# Patient Record
Sex: Female | Born: 1990 | Race: White | Hispanic: No | Marital: Single | Smoking: Never smoker
Health system: Southern US, Community
[De-identification: ages and names within clinical notes are randomized; demographics above are authoritative.]

## PROBLEM LIST (undated history)

## (undated) DIAGNOSIS — F419 Anxiety disorder, unspecified: Secondary | ICD-10-CM

## (undated) DIAGNOSIS — F329 Major depressive disorder, single episode, unspecified: Secondary | ICD-10-CM

## (undated) DIAGNOSIS — R55 Syncope and collapse: Secondary | ICD-10-CM

## (undated) DIAGNOSIS — N809 Endometriosis, unspecified: Secondary | ICD-10-CM

## (undated) DIAGNOSIS — R51 Headache: Secondary | ICD-10-CM

## (undated) DIAGNOSIS — G43909 Migraine, unspecified, not intractable, without status migrainosus: Secondary | ICD-10-CM

## (undated) DIAGNOSIS — F32A Depression, unspecified: Secondary | ICD-10-CM

## (undated) DIAGNOSIS — R519 Headache, unspecified: Secondary | ICD-10-CM

## (undated) DIAGNOSIS — E349 Endocrine disorder, unspecified: Secondary | ICD-10-CM

## (undated) HISTORY — DX: Migraine, unspecified, not intractable, without status migrainosus: G43.909

## (undated) HISTORY — DX: Depression, unspecified: F32.A

## (undated) HISTORY — DX: Endometriosis, unspecified: N80.9

## (undated) HISTORY — PX: LAPAROSCOPY: SHX197

## (undated) HISTORY — DX: Anxiety disorder, unspecified: F41.9

## (undated) HISTORY — DX: Endocrine disorder, unspecified: E34.9

## (undated) HISTORY — DX: Syncope and collapse: R55

## (undated) HISTORY — DX: Headache, unspecified: R51.9

## (undated) HISTORY — DX: Headache: R51

## (undated) HISTORY — DX: Major depressive disorder, single episode, unspecified: F32.9

---

## 2012-04-27 HISTORY — PX: APPENDECTOMY: SHX54

## 2012-06-25 LAB — CBC
HCT: 42.6 % (ref 35.0–47.0)
HGB: 14.4 g/dL (ref 12.0–16.0)
MCH: 32.8 pg (ref 26.0–34.0)
MCHC: 33.9 g/dL (ref 32.0–36.0)
MCV: 97 fL (ref 80–100)
Platelet: 243 10*3/uL (ref 150–440)
RDW: 12.3 % (ref 11.5–14.5)
WBC: 15.8 10*3/uL — ABNORMAL HIGH (ref 3.6–11.0)

## 2012-06-25 LAB — COMPREHENSIVE METABOLIC PANEL
Albumin: 4.5 g/dL (ref 3.4–5.0)
BUN: 6 mg/dL — ABNORMAL LOW (ref 7–18)
Bilirubin,Total: 0.9 mg/dL (ref 0.2–1.0)
EGFR (African American): >60
EGFR (Non-African Amer.): >60
Glucose: 112 mg/dL — ABNORMAL HIGH (ref 65–99)
Osmolality: 272 (ref 275–301)
Potassium: 4.4 mmol/L (ref 3.5–5.1)
SGOT(AST): 16 U/L (ref 15–37)
SGPT (ALT): 19 U/L (ref 12–78)
Sodium: 137 mmol/L (ref 136–145)

## 2012-06-25 LAB — URINALYSIS, COMPLETE
Bilirubin,UR: NEGATIVE
Glucose,UR: NEGATIVE mg/dL (ref 0–75)
Ketone: NEGATIVE
Nitrite: POSITIVE
Protein: NEGATIVE
RBC,UR: 2 /HPF (ref 0–5)
Squamous Epithelial: 2
WBC UR: 7 /HPF (ref 0–5)

## 2012-06-25 LAB — LIPASE, BLOOD: Lipase: 110 U/L (ref 73–393)

## 2012-06-27 ENCOUNTER — Inpatient Hospital Stay: Payer: Self-pay | Admitting: Surgery

## 2012-06-27 LAB — URINALYSIS, COMPLETE
Bilirubin,UR: NEGATIVE
Blood: NEGATIVE
Glucose,UR: NEGATIVE mg/dL (ref 0–75)
Nitrite: NEGATIVE
Ph: 6 (ref 4.5–8.0)
Protein: NEGATIVE
RBC,UR: 1 /HPF (ref 0–5)
Squamous Epithelial: 11
WBC UR: 2 /HPF (ref 0–5)

## 2012-06-28 LAB — PATHOLOGY REPORT

## 2014-02-20 ENCOUNTER — Ambulatory Visit (INDEPENDENT_AMBULATORY_CARE_PROVIDER_SITE_OTHER): Payer: PRIVATE HEALTH INSURANCE | Admitting: Licensed Clinical Social Worker

## 2014-02-20 DIAGNOSIS — F321 Major depressive disorder, single episode, moderate: Secondary | ICD-10-CM

## 2014-02-27 ENCOUNTER — Encounter: Payer: Self-pay | Admitting: Physician Assistant

## 2014-02-27 ENCOUNTER — Ambulatory Visit (INDEPENDENT_AMBULATORY_CARE_PROVIDER_SITE_OTHER): Payer: PRIVATE HEALTH INSURANCE | Admitting: Physician Assistant

## 2014-02-27 ENCOUNTER — Other Ambulatory Visit (HOSPITAL_COMMUNITY)
Admission: RE | Admit: 2014-02-27 | Discharge: 2014-02-27 | Disposition: A | Discharge status: Home / Self Care | Payer: 59 | Source: Ambulatory Visit | Attending: Physician Assistant | Admitting: Physician Assistant

## 2014-02-27 ENCOUNTER — Ambulatory Visit (INDEPENDENT_AMBULATORY_CARE_PROVIDER_SITE_OTHER): Payer: PRIVATE HEALTH INSURANCE | Admitting: Licensed Clinical Social Worker

## 2014-02-27 VITALS — BP 117/66 | HR 89 | Temp 98.4°F | Resp 16 | Ht 63.5 in | Wt 119.2 lb

## 2014-02-27 DIAGNOSIS — N760 Acute vaginitis: Secondary | ICD-10-CM | POA: Diagnosis present

## 2014-02-27 DIAGNOSIS — N898 Other specified noninflammatory disorders of vagina: Secondary | ICD-10-CM

## 2014-02-27 DIAGNOSIS — F4001 Agoraphobia with panic disorder: Secondary | ICD-10-CM | POA: Insufficient documentation

## 2014-02-27 DIAGNOSIS — F321 Major depressive disorder, single episode, moderate: Secondary | ICD-10-CM | POA: Diagnosis not present

## 2014-02-27 DIAGNOSIS — B9689 Other specified bacterial agents as the cause of diseases classified elsewhere: Secondary | ICD-10-CM | POA: Insufficient documentation

## 2014-02-27 DIAGNOSIS — Z113 Encounter for screening for infections with a predominantly sexual mode of transmission: Secondary | ICD-10-CM | POA: Insufficient documentation

## 2014-02-27 MED ORDER — DIAZEPAM 2 MG PO TABS: 2.0000 mg | ORAL_TABLET | Freq: Two times a day (BID) | ORAL | Status: DC | PRN

## 2014-02-27 MED ORDER — ESCITALOPRAM OXALATE 20 MG PO TABS: ORAL_TABLET | ORAL | Status: DC

## 2014-02-27 NOTE — Progress Notes (Signed)
Patient presents to clinic today to establish care.  Acute Concerns: Anxiety and Depression -- Endorses history over the past year worsening over the past few months.  Endorses panic attacks and difficulty with open spaces and large crowds.  Endorses miscarriage earlier in the area.  Is seeing Berniece AndreasJulie Whitt for counseling once weekly.  Endorses difficulty with work.  Endorses depressed mood and anhedonia.  Denies SI/HI.  Has never been on medications for symptoms.  Endorses foul-smelling discharge from the vagina x 1 day.  Is sexually active with one female partner.  Is currently on junel for contraception.  Endorses good condom use.  Past Medical History  Diagnosis Date  . Depression   . Anxiety   . Fainting spell   . Frequent headaches   . Migraines   . Endometriosis   . Hormone imbalance     Past Surgical History  Procedure Laterality Date  . Laparoscopy      x2  . Appendectomy  2014    No current outpatient prescriptions on file prior to visit.   No current facility-administered medications on file prior to visit.    Allergies  Allergen Reactions  . Codeine Nausea Only  . Mirena [Levonorgestrel]     Damage to Uterus    Family History  Problem Relation Age of Onset  . Alcoholism Father     Deceased  . Liver cancer Father   . Cirrhosis Father   . Lung cancer Father   . Thyroid disease Mother     Living  . Anxiety disorder Mother   . Breast cancer Paternal Grandmother   . Heart disease Maternal Grandfather   . Thyroid disease Maternal Grandmother   . Aneurysm Maternal Aunt     Brain  . Colon cancer Paternal Uncle   . Hyperlipidemia Brother   . ADD / ADHD Brother     History   Social History  . Marital Status: Single    Spouse Name: N/A    Number of Children: N/A  . Years of Education: N/A   Occupational History  . Not on file.   Social History Main Topics  . Smoking status: Never Smoker   . Smokeless tobacco: Never Used  . Alcohol Use: 0.0  oz/week    0 Not specified per week     Comment: rare  . Drug Use: Yes     Comment: rare Marijuana  . Sexual Activity:    Partners: Male    Birth Control/ Protection: Pill   Other Topics Concern  . Not on file   Social History Narrative   ROS See HPI.  All other ROS are negative.  BP 117/66 mmHg  Pulse 89  Temp(Src) 98.4 F (36.9 C) (Oral)  Resp 16  Ht 5' 3.5" (1.613 m)  Wt 119 lb 4 oz (54.091 kg)  BMI 20.79 kg/m2  SpO2 99%  Physical Exam  Constitutional: She is oriented to person, place, and time and well-developed, well-nourished, and in no distress.  HENT:  Head: Normocephalic and atraumatic.  Right Ear: External ear normal.  Left Ear: External ear normal.  Nose: Nose normal.  Mouth/Throat: Oropharynx is clear and moist. No oropharyngeal exudate.  TM within normal limits bilaterally.  Eyes: Conjunctivae are normal. Pupils are equal, round, and reactive to light.  Neck: Neck supple. No thyromegaly present.  Cardiovascular: Normal rate, regular rhythm, normal heart sounds and intact distal pulses.   Pulmonary/Chest: Effort normal and breath sounds normal. No respiratory distress. She has no wheezes.  She has no rales. She exhibits no tenderness.  Genitourinary:  Patient defers exam due to female provider.  Lymphadenopathy:    She has no cervical adenopathy.  Neurological: She is alert and oriented to person, place, and time. No cranial nerve deficit.  Skin: Skin is warm. No rash noted.  Psychiatric: Judgment normal. Her mood appears anxious. She does not express impulsivity. She exhibits a depressed mood. She expresses no homicidal and no suicidal ideation. She expresses no suicidal plans and no homicidal plans. She exhibits ordered thought content.  Vitals reviewed.  Assessment/Plan: Foul smelling vaginal discharge Patient defers exam.  Sounds like BV.  Will obtain urine ancillary testing for causes of vaginitis.  Begin probiotic.  Conservative/Home measures  discussed with patient.  Will treat based on results.  Major depressive disorder, single episode, moderate Will begin Lexapro 10 mg nightly x 2 weeks.  Then increase to 20 mg nightly.  Continue weekly counseling.  ADRs of medication discussed with patient including rare but serious side effect of SI/HI.  Patient knows to stop medication and call office/seek help at ER if this occurs.  Follow-up in 1 month.  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  Will converse with patient's counselor Berniece AndreasJulie Whitt.  Continue weekly visits. Rx Lexapro.  Rx Valium to use only if needed for severe acute anxiety. Follow-up in 1 month.  Return sooner if needed.

## 2014-02-27 NOTE — Assessment & Plan Note (Signed)
Will begin Lexapro 10 mg nightly x 2 weeks.  Then increase to 20 mg nightly.  Continue weekly counseling.  ADRs of medication discussed with patient including rare but serious side effect of SI/HI.  Patient knows to stop medication and call office/seek help at ER if this occurs.  Follow-up in 1 month.

## 2014-02-27 NOTE — Patient Instructions (Signed)
Please begin taking Lexapro as directed each night.  Use the Valium as directed only if needed for severe acute anxiety.  Continue seeing Raynelle FanningJulie weekly.  Follow-up with me in 1 month.  We will be checking today to see if your anxiety is related to an overactive thyroid.  If you develop worsening mood or suicidal thoughts, please stop medication and call the office. If after hours, proceed to an Urgent Care.  For vaginal discharge, I am sending a sample for testing.  We will treat based on results.  Begin a daily probiotic.  Avoid use of scented or dyed soaps to that area.  Again, Follow-up in 1 month.  Depression Depression refers to feeling sad, low, down in the dumps, blue, gloomy, or empty. In general, there are two kinds of depression: 1. Normal sadness or normal grief. This kind of depression is one that we all feel from time to time after upsetting life experiences, such as the loss of a job or the ending of a relationship. This kind of depression is considered normal, is short lived, and resolves within a few days to 2 weeks. Depression experienced after the loss of a loved one (bereavement) often lasts longer than 2 weeks but normally gets better with time. 2. Clinical depression. This kind of depression lasts longer than normal sadness or normal grief or interferes with your ability to function at home, at work, and in school. It also interferes with your personal relationships. It affects almost every aspect of your life. Clinical depression is an illness. Symptoms of depression can also be caused by conditions other than those mentioned above, such as:  Physical illness. Some physical illnesses, including underactive thyroid gland (hypothyroidism), severe anemia, specific types of cancer, diabetes, uncontrolled seizures, heart and lung problems, strokes, and chronic pain are commonly associated with symptoms of depression.  Side effects of some prescription medicine. In some people, certain  types of medicine can cause symptoms of depression.  Substance abuse. Abuse of alcohol and illicit drugs can cause symptoms of depression. SYMPTOMS Symptoms of normal sadness and normal grief include the following:  Feeling sad or crying for short periods of time.  Not caring about anything (apathy).  Difficulty sleeping or sleeping too much.  No longer able to enjoy the things you used to enjoy.  Desire to be by oneself all the time (social isolation).  Lack of energy or motivation.  Difficulty concentrating or remembering.  Change in appetite or weight.  Restlessness or agitation. Symptoms of clinical depression include the same symptoms of normal sadness or normal grief and also the following symptoms:  Feeling sad or crying all the time.  Feelings of guilt or worthlessness.  Feelings of hopelessness or helplessness.  Thoughts of suicide or the desire to harm yourself (suicidal ideation).  Loss of touch with reality (psychotic symptoms). Seeing or hearing things that are not real (hallucinations) or having false beliefs about your life or the people around you (delusions and paranoia). DIAGNOSIS  The diagnosis of clinical depression is usually based on how bad the symptoms are and how long they have lasted. Your health care provider will also ask you questions about your medical history and substance use to find out if physical illness, use of prescription medicine, or substance abuse is causing your depression. Your health care provider may also order blood tests. TREATMENT  Often, normal sadness and normal grief do not require treatment. However, sometimes antidepressant medicine is given for bereavement to ease the depressive symptoms  until they resolve. The treatment for clinical depression depends on how bad the symptoms are but often includes antidepressant medicine, counseling with a mental health professional, or both. Your health care provider will help to determine  what treatment is best for you. Depression caused by physical illness usually goes away with appropriate medical treatment of the illness. If prescription medicine is causing depression, talk with your health care provider about stopping the medicine, decreasing the dose, or changing to another medicine. Depression caused by the abuse of alcohol or illicit drugs goes away when you stop using these substances. Some adults need professional help in order to stop drinking or using drugs. SEEK IMMEDIATE MEDICAL CARE IF:  You have thoughts about hurting yourself or others.  You lose touch with reality (have psychotic symptoms).  You are taking medicine for depression and have a serious side effect. FOR MORE INFORMATION  National Alliance on Mental Illness: www.nami.[employer]  National Institute of Mental Health: http://www.maynard.net/www.nimh.nih.gov Document Released: 04/10/2000 Document Revised: 08/28/2013 Document Reviewed: 07/13/2011 [facility] Patient Information 2015 [employer] [place]. This information is not intended to replace advice given to you by your health care provider. Make sure you discuss any questions you have with your health care provider.

## 2014-02-27 NOTE — Progress Notes (Signed)
Pre visit review using our clinic review tool, if applicable. No additional management support is needed unless otherwise documented below in the visit note/SLS  

## 2014-02-27 NOTE — Assessment & Plan Note (Signed)
Significant Agoraphobia at present.  Will converse with patient's counselor Berniece AndreasJulie Whitt.  Continue weekly visits. Rx Lexapro.  Rx Valium to use only if needed for severe acute anxiety. Follow-up in 1 month.  Return sooner if needed.

## 2014-02-27 NOTE — Assessment & Plan Note (Signed)
Patient defers exam.  Sounds like BV.  Will obtain urine ancillary testing for causes of vaginitis.  Begin probiotic.  Conservative/Home measures discussed with patient.  Will treat based on results.

## 2014-02-28 LAB — T4, FREE: Free T4: 0.79 ng/dL (ref 0.60–1.60)

## 2014-02-28 LAB — URINE CYTOLOGY ANCILLARY ONLY
CHLAMYDIA, DNA PROBE: NEGATIVE
Neisseria Gonorrhea: NEGATIVE
TRICH (WINDOWPATH): NEGATIVE

## 2014-02-28 LAB — TSH: TSH: 2.58 u[IU]/mL (ref 0.35–4.50)

## 2014-03-01 ENCOUNTER — Telehealth: Payer: Self-pay | Admitting: Physician Assistant

## 2014-03-01 DIAGNOSIS — B9689 Other specified bacterial agents as the cause of diseases classified elsewhere: Secondary | ICD-10-CM

## 2014-03-01 DIAGNOSIS — N760 Acute vaginitis: Principal | ICD-10-CM

## 2014-03-01 LAB — URINE CYTOLOGY ANCILLARY ONLY: CANDIDA VAGINITIS: NEGATIVE

## 2014-03-01 MED ORDER — METRONIDAZOLE 500 MG PO TABS: 500.0000 mg | ORAL_TABLET | Freq: Two times a day (BID) | ORAL | Status: DC

## 2014-03-01 NOTE — Telephone Encounter (Signed)
Caller name: Rosanne Sackkasey Relation to pt: self Call back number: [phone] Pharmacy:  Reason for call:   Patient states that she took a 1 valium last night and 1 this morning and she wants to know if she is taking too much.

## 2014-03-01 NOTE — Telephone Encounter (Addendum)
Advised pt it was okay to take, but to avoid taking unless having moderate to severe anxiety - pt verbalized understanding

## 2014-03-01 NOTE — Telephone Encounter (Signed)
Urine ancillary testing reveals bacterial vaginosis. I have sent in an antibiotic (Metronidazole) to take as directed for 7 days.  No alcohol while on this medication and for 24 hours after last pill.  Take a daily probiotic.  If symptoms persist, return to office for examination.

## 2014-03-02 NOTE — Telephone Encounter (Signed)
Pt notified- verbalized understanding.

## 2014-03-06 ENCOUNTER — Ambulatory Visit (INDEPENDENT_AMBULATORY_CARE_PROVIDER_SITE_OTHER): Payer: PRIVATE HEALTH INSURANCE | Admitting: Licensed Clinical Social Worker

## 2014-03-06 DIAGNOSIS — F321 Major depressive disorder, single episode, moderate: Secondary | ICD-10-CM

## 2014-03-15 ENCOUNTER — Ambulatory Visit (INDEPENDENT_AMBULATORY_CARE_PROVIDER_SITE_OTHER): Payer: PRIVATE HEALTH INSURANCE | Admitting: Licensed Clinical Social Worker

## 2014-03-15 DIAGNOSIS — F321 Major depressive disorder, single episode, moderate: Secondary | ICD-10-CM

## 2014-03-29 ENCOUNTER — Ambulatory Visit (INDEPENDENT_AMBULATORY_CARE_PROVIDER_SITE_OTHER): Payer: PRIVATE HEALTH INSURANCE | Admitting: Licensed Clinical Social Worker

## 2014-03-29 DIAGNOSIS — F321 Major depressive disorder, single episode, moderate: Secondary | ICD-10-CM

## 2014-03-30 ENCOUNTER — Ambulatory Visit: Payer: 59 | Admitting: Physician Assistant

## 2014-04-03 ENCOUNTER — Ambulatory Visit: Payer: 59 | Admitting: Physician Assistant

## 2014-04-03 ENCOUNTER — Telehealth: Payer: Self-pay | Admitting: *Deleted

## 2014-04-03 NOTE — Telephone Encounter (Signed)
Pt did not show for appointment 04/03/2014 at 10:15am for 1 month follow up

## 2014-04-03 NOTE — Telephone Encounter (Signed)
Please call patient to reschedule within the next few weeks.  Thank you.

## 2014-04-05 ENCOUNTER — Ambulatory Visit (INDEPENDENT_AMBULATORY_CARE_PROVIDER_SITE_OTHER): Payer: PRIVATE HEALTH INSURANCE | Admitting: Licensed Clinical Social Worker

## 2014-04-05 DIAGNOSIS — F321 Major depressive disorder, single episode, moderate: Secondary | ICD-10-CM

## 2014-04-10 ENCOUNTER — Ambulatory Visit: Payer: Self-pay | Admitting: Physician Assistant

## 2014-04-11 ENCOUNTER — Ambulatory Visit (INDEPENDENT_AMBULATORY_CARE_PROVIDER_SITE_OTHER): Payer: 59 | Admitting: Physician Assistant

## 2014-04-11 ENCOUNTER — Encounter: Payer: Self-pay | Admitting: Physician Assistant

## 2014-04-11 ENCOUNTER — Telehealth: Payer: Self-pay | Admitting: *Deleted

## 2014-04-11 VITALS — BP 105/71 | HR 75 | Temp 98.4°F | Resp 16 | Ht 63.5 in | Wt 120.5 lb

## 2014-04-11 DIAGNOSIS — N760 Acute vaginitis: Secondary | ICD-10-CM

## 2014-04-11 DIAGNOSIS — F321 Major depressive disorder, single episode, moderate: Secondary | ICD-10-CM

## 2014-04-11 DIAGNOSIS — B9689 Other specified bacterial agents as the cause of diseases classified elsewhere: Secondary | ICD-10-CM

## 2014-04-11 DIAGNOSIS — A499 Bacterial infection, unspecified: Secondary | ICD-10-CM

## 2014-04-11 MED ORDER — SERTRALINE HCL 50 MG PO TABS: 25.0000 mg | ORAL_TABLET | Freq: Every day | ORAL | Status: DC

## 2014-04-11 MED ORDER — CLINDAMYCIN PHOSPHATE 2 % VA CREA: 1.0000 | TOPICAL_CREAM | Freq: Every day | VAGINAL | Status: DC

## 2014-04-11 NOTE — Progress Notes (Signed)
Pre visit review using our clinic review tool, if applicable. No additional management support is needed unless otherwise documented below in the visit note/SLS  

## 2014-04-11 NOTE — Telephone Encounter (Signed)
Please disregard message below.  Where she sees Berniece AndreasJulie Whitt in our office, she does not have to check in. So she has been here, but did not check in and I did not see her to check her in.  Per Jasmine DecemberSharon, OK for pt to be seen.

## 2014-04-11 NOTE — Telephone Encounter (Signed)
Patient to reschedule at earliest convenience.  She will need follow-up before refills will be given.  If she continues to no-show for appointments, she is at risk for dismissal.

## 2014-04-11 NOTE — Assessment & Plan Note (Signed)
Some residual symptoms. Rx Clindamycin vaginal once nightly for 1 week.  Probiotic daily.  Vinegar wipes discussed to keep pH in balance.

## 2014-04-11 NOTE — Patient Instructions (Signed)
Please start taking the Zoloft as directed. Continue Valium on an as needed basis.  Follow-up with Raynelle FanningJulie as scheduled.  Follow-up with me in 2 months.  Call or return sooner if needed.  Take the vaginal applicator as directed for bacterial vaginosis.  Make sure to take a daily probiotic.  Call if not resolving.

## 2014-04-11 NOTE — Assessment & Plan Note (Signed)
Has weaned off of Lexapro due to side effect.  Will begin sertraline 25 mg daily.  Increase to 50 mg after 2 weeks. Continue follow-up with Counseling.  Follow-up in 2 months.

## 2014-04-11 NOTE — Progress Notes (Signed)
Patient presents to clinic today for follow-up of MDD and panic disorder after starting Lexapro and Valium.  Patient continues to see therapist weekly.  Endorses improvement in anxiety with counseling and Valium. Has had to wean off of the Lexapro due to nausea and vomiting. Denies SI/HI.   Past Medical History  Diagnosis Date  . Depression   . Anxiety   . Fainting spell   . Frequent headaches   . Migraines   . Endometriosis   . Hormone imbalance     Current Outpatient Prescriptions on File Prior to Visit  Medication Sig Dispense Refill  . diazepam (VALIUM) 2 MG tablet Take 1 tablet (2 mg total) by mouth every 12 (twelve) hours as needed for anxiety. 30 tablet 0  . Multiple Vitamins-Minerals (MULTIVITAMIN GUMMIES ADULT) CHEW Chew by mouth daily.    . norethindrone-ethinyl estradiol (JUNEL FE,GILDESS FE,LOESTRIN FE) 1-20 MG-MCG tablet Take 1 tablet by mouth daily.    . vitamin B-12 (CYANOCOBALAMIN) 1000 MCG tablet Take 1,000 mcg by mouth daily.     No current facility-administered medications on file prior to visit.    Allergies  Allergen Reactions  . Codeine Nausea Only  . Mirena [Levonorgestrel]     Damage to Uterus    Family History  Problem Relation Age of Onset  . Alcoholism Father     Deceased  . Liver cancer Father   . Cirrhosis Father   . Lung cancer Father   . Thyroid disease Mother     Living  . Anxiety disorder Mother   . Breast cancer Paternal Grandmother   . Heart disease Maternal Grandfather   . Thyroid disease Maternal Grandmother   . Aneurysm Maternal Aunt     Brain  . Colon cancer Paternal Uncle   . Hyperlipidemia Brother   . ADD / ADHD Brother     History   Social History  . Marital Status: Single    Spouse Name: N/A    Number of Children: N/A  . Years of Education: N/A   Social History Main Topics  . Smoking status: Never Smoker   . Smokeless tobacco: Never Used  . Alcohol Use: 0.0 oz/week    0 Not specified per week     Comment:  rare  . Drug Use: Yes     Comment: rare Marijuana  . Sexual Activity:    Partners: Male    Birth Control/ Protection: Pill   Other Topics Concern  . None   Social History Narrative   Review of Systems - See HPI.  All other ROS are negative.  BP 105/71 mmHg  Pulse 75  Temp(Src) 98.4 F (36.9 C) (Oral)  Resp 16  Ht 5' 3.5" (1.613 m)  Wt 120 lb 8 oz (54.658 kg)  BMI 21.01 kg/m2  SpO2 100%  Physical Exam  Constitutional: She is oriented to person, place, and time and well-developed, well-nourished, and in no distress.  HENT:  Head: Normocephalic and atraumatic.  Eyes: Conjunctivae are normal.  Neck: Neck supple.  Cardiovascular: Normal rate, regular rhythm, normal heart sounds and intact distal pulses.   Pulmonary/Chest: Effort normal and breath sounds normal. No respiratory distress. She has no wheezes. She has no rales. She exhibits no tenderness.  Neurological: She is alert and oriented to person, place, and time.  Skin: Skin is warm and dry. No rash noted.  Psychiatric: Affect normal.  Vitals reviewed.  Recent Results (from the past 2160 hour(s))  Urine cytology ancillary only  Status: None   Collection Time: 02/27/14 12:00 AM  Result Value Ref Range   Chlamydia CT: Negative     Comment: Normal Reference Range - Negative   Neisseria gonorrhea NG: Negative     Comment: Normal Reference Range - Negative   Trich Trichomonas: Negative     Comment: Normal Reference Range - Negative  Urine cytology ancillary only     Status: Abnormal   Collection Time: 02/27/14 12:00 AM  Result Value Ref Range   Bacterial vaginitis (A)     **POSITIVE for Gardnerella vaginalis POSITIVE for Atopobium vaginae POSITIVE for Megasphaera 1 POSITIVE for BVAB2**    Comment: Normal Reference Range - Negative   Candida vaginitis Negative for Candida Vaginitis Microorganisms     Comment: Normal Reference Range - Negative  TSH     Status: None   Collection Time: 02/27/14  2:32 PM  Result  Value Ref Range   TSH 2.58 0.35 - 4.50 uIU/mL  T4, free     Status: None   Collection Time: 02/27/14  2:32 PM  Result Value Ref Range   Free T4 0.79 0.60 - 1.60 ng/dL    Assessment/Plan: Bacterial vaginosis Some residual symptoms. Rx Clindamycin vaginal once nightly for 1 week.  Probiotic daily.  Vinegar wipes discussed to keep pH in balance.  Major depressive disorder, single episode, moderate Has weaned off of Lexapro due to side effect.  Will begin sertraline 25 mg daily.  Increase to 50 mg after 2 weeks. Continue follow-up with Counseling.  Follow-up in 2 months.

## 2014-04-11 NOTE — Telephone Encounter (Signed)
Pt did not show for appointment 04/11/2014 at 10:15am for 1 month follow up

## 2014-06-01 ENCOUNTER — Ambulatory Visit (INDEPENDENT_AMBULATORY_CARE_PROVIDER_SITE_OTHER): Payer: 59 | Admitting: Physician Assistant

## 2014-06-01 ENCOUNTER — Encounter: Payer: Self-pay | Admitting: Physician Assistant

## 2014-06-01 VITALS — BP 104/69 | HR 81 | Temp 98.4°F | Resp 14 | Ht 63.5 in | Wt 123.5 lb

## 2014-06-01 DIAGNOSIS — F321 Major depressive disorder, single episode, moderate: Secondary | ICD-10-CM

## 2014-06-01 NOTE — Progress Notes (Signed)
Patient presents to clinic today for follow-up of her MDD after initiating Zoloft 25 mg nightly.  Patient endorses hypersomnolence with taking medication in the AM. Stopped medication after a few days because she couldn't tolerate the medication.  Denies SI/HI.  Has upcoming appointment with her counselor, Berniece Andreas.  Past Medical History  Diagnosis Date  . Depression   . Anxiety   . Fainting spell   . Frequent headaches   . Migraines   . Endometriosis   . Hormone imbalance     Current Outpatient Prescriptions on File Prior to Visit  Medication Sig Dispense Refill  . diazepam (VALIUM) 2 MG tablet Take 1 tablet (2 mg total) by mouth every 12 (twelve) hours as needed for anxiety. 30 tablet 0  . Multiple Vitamins-Minerals (MULTIVITAMIN GUMMIES ADULT) CHEW Chew by mouth daily.    . norethindrone-ethinyl estradiol (JUNEL FE,GILDESS FE,LOESTRIN FE) 1-20 MG-MCG tablet Take 1 tablet by mouth daily.    . sertraline (ZOLOFT) 50 MG tablet Take 0.5 tablets (25 mg total) by mouth at bedtime. Increase to 1 tablet at bedtime after 2 weeks (Patient taking differently: Take 50 mg by mouth at bedtime. Increase to 1 tablet at bedtime after 2 weeks) 30 tablet 3  . vitamin B-12 (CYANOCOBALAMIN) 1000 MCG tablet Take 1,000 mcg by mouth daily.     No current facility-administered medications on file prior to visit.    Allergies  Allergen Reactions  . Codeine Nausea Only  . Mirena [Levonorgestrel]     Damage to Uterus    Family History  Problem Relation Age of Onset  . Alcoholism Father     Deceased  . Liver cancer Father   . Cirrhosis Father   . Lung cancer Father   . Thyroid disease Mother     Living  . Anxiety disorder Mother   . Breast cancer Paternal Grandmother   . Heart disease Maternal Grandfather   . Thyroid disease Maternal Grandmother   . Aneurysm Maternal Aunt     Brain  . Colon cancer Paternal Uncle   . Hyperlipidemia Brother   . ADD / ADHD Brother     History   Social  History  . Marital Status: Single    Spouse Name: N/A    Number of Children: N/A  . Years of Education: N/A   Social History Main Topics  . Smoking status: Never Smoker   . Smokeless tobacco: Never Used  . Alcohol Use: 0.0 oz/week    0 Not specified per week     Comment: rare  . Drug Use: Yes     Comment: rare Marijuana  . Sexual Activity:    Partners: Male    Birth Control/ Protection: Pill   Other Topics Concern  . None   Social History Narrative   Review of Systems - See HPI.  All other ROS are negative.  BP 104/69 mmHg  Pulse 81  Temp(Src) 98.4 F (36.9 C) (Oral)  Resp 14  Ht 5' 3.5" (1.613 m)  Wt 123 lb 8 oz (56.019 kg)  BMI 21.53 kg/m2  SpO2 100%  Physical Exam  Constitutional: She is oriented to person, place, and time and well-developed, well-nourished, and in no distress.  HENT:  Head: Normocephalic and atraumatic.  Cardiovascular: Normal rate, regular rhythm, normal heart sounds and intact distal pulses.   Pulmonary/Chest: Effort normal and breath sounds normal. No respiratory distress. She has no wheezes. She has no rales. She exhibits no tenderness.  Neurological: She is alert and  oriented to person, place, and time.  Skin: Skin is warm and dry. No rash noted.  Psychiatric: Affect normal.  Vitals reviewed.  Assessment/Plan: Major depressive disorder, single episode, moderate Will restart 25 mg Zoloft with evening dosing as this should promote more restful sleep and mitigate somnolence noted when taking medication in the daytime. Follow-up in 1 week via phone to assess for any residual side effects of medication.  If persistent, will stop Zoloft and attempt trial of SR Wellbutrin.

## 2014-06-01 NOTE — Patient Instructions (Signed)
Please attempt to restart the 1/2 tablet of Zoloft -- taking in the evening instead of at night.  This will help with the drowsiness the next morning.  Call me if this is not improved over the next week. If not, we will start a medication called Wellbutrin.   Follow-up in 3 months if all is going well.

## 2014-06-01 NOTE — Progress Notes (Signed)
Pre visit review using our clinic review tool, if applicable. No additional management support is needed unless otherwise documented below in the visit note/SLS  

## 2014-06-03 NOTE — Assessment & Plan Note (Signed)
Will restart 25 mg Zoloft with evening dosing as this should promote more restful sleep and mitigate somnolence noted when taking medication in the daytime. Follow-up in 1 week via phone to assess for any residual side effects of medication.  If persistent, will stop Zoloft and attempt trial of SR Wellbutrin.

## 2014-06-07 ENCOUNTER — Ambulatory Visit (INDEPENDENT_AMBULATORY_CARE_PROVIDER_SITE_OTHER): Payer: Managed Care, Other (non HMO) | Admitting: Licensed Clinical Social Worker

## 2014-06-07 DIAGNOSIS — F321 Major depressive disorder, single episode, moderate: Secondary | ICD-10-CM

## 2014-06-12 ENCOUNTER — Ambulatory Visit: Payer: Self-pay | Admitting: Physician Assistant

## 2014-06-14 ENCOUNTER — Ambulatory Visit: Payer: PRIVATE HEALTH INSURANCE | Admitting: Licensed Clinical Social Worker

## 2014-06-19 ENCOUNTER — Ambulatory Visit (INDEPENDENT_AMBULATORY_CARE_PROVIDER_SITE_OTHER): Payer: Managed Care, Other (non HMO) | Admitting: Licensed Clinical Social Worker

## 2014-06-19 DIAGNOSIS — F321 Major depressive disorder, single episode, moderate: Secondary | ICD-10-CM | POA: Diagnosis not present

## 2014-06-28 ENCOUNTER — Ambulatory Visit (INDEPENDENT_AMBULATORY_CARE_PROVIDER_SITE_OTHER): Payer: Managed Care, Other (non HMO) | Admitting: Licensed Clinical Social Worker

## 2014-06-28 DIAGNOSIS — F321 Major depressive disorder, single episode, moderate: Secondary | ICD-10-CM | POA: Diagnosis not present

## 2014-07-10 ENCOUNTER — Ambulatory Visit (INDEPENDENT_AMBULATORY_CARE_PROVIDER_SITE_OTHER): Payer: Managed Care, Other (non HMO) | Admitting: Licensed Clinical Social Worker

## 2014-07-10 DIAGNOSIS — F321 Major depressive disorder, single episode, moderate: Secondary | ICD-10-CM

## 2014-07-24 ENCOUNTER — Ambulatory Visit (INDEPENDENT_AMBULATORY_CARE_PROVIDER_SITE_OTHER): Payer: Managed Care, Other (non HMO) | Admitting: Licensed Clinical Social Worker

## 2014-07-24 DIAGNOSIS — F321 Major depressive disorder, single episode, moderate: Secondary | ICD-10-CM | POA: Diagnosis not present

## 2014-08-07 ENCOUNTER — Ambulatory Visit: Payer: 59 | Admitting: Licensed Clinical Social Worker

## 2014-08-17 NOTE — H&P (Signed)
Subjective/Chief Complaint RLQ abdominal pain   History of Present Illness 24 y/o female with RLQ abdominal pain starting yesterday at 9 am.  This was followed by nausea and emesis and then fever.  No diarrhea, no sick contacts.   Past History cardio-neurogenic syncope.   Past Med/Surgical Hx:  pt denies medical/surgical hx:   ALLERGIES:  Codeine: N/V/Diarrhea   Other Allergies none   Family and Social History:  Family History Non-Contributory   Social History negative tobacco, positive ETOH, negative Illicit drugs, social   Place of Living Home   Review of Systems:  Subjective/Chief Complaint see above   Abdominal Pain Yes   Nausea/Vomiting Yes   Physical Exam:  GEN no acute distress, thin, 100.7 p112 18 bp 118/75   HEENT pale conjunctivae   NECK supple   RESP normal resp effort  clear BS   CARD regular rate  no murmur  no thrills   ABD positive tenderness  distended  rovsing's sign present.   LYMPH negative neck   EXTR negative cyanosis/clubbing   SKIN normal to palpation   NEURO cranial nerves intact   PSYCH A+O to time, place, person   Lab Results: Hepatic:  01-Mar-14 13:55   Bilirubin, Total 0.9  Alkaline Phosphatase 79  SGPT (ALT) 19  SGOT (AST) 16  Total Protein, Serum 8.0  Albumin, Serum 4.5  Routine Chem:  01-Mar-14 13:55   Lipase 110 (Result(s) reported on 25 Jun 2012 at 02:15PM.)  Glucose, Serum  112  BUN  6  Creatinine (comp) 0.88  Sodium, Serum 137  Potassium, Serum 4.4  Chloride, Serum 101  CO2, Serum 31  Calcium (Total), Serum 9.7  Osmolality (calc) 272  eGFR (African American) >60  eGFR (Non-African American) >60 (eGFR values <63m/min/1.73 m2 may be an indication of chronic kidney disease (CKD). Calculated eGFR is useful in patients with stable renal function. The eGFR calculation will not be reliable in acutely ill patients when serum creatinine is changing rapidly. It is not useful in  patients on dialysis. The  eGFR calculation may not be applicable to patients at the low and high extremes of body sizes, pregnant women, and vegetarians.)  Anion Gap  5  Routine UA:  01-Mar-14 13:55   Color (UA) Yellow  Clarity (UA) Cloudy  Glucose (UA) Negative  Bilirubin (UA) Negative  Ketones (UA) Negative  Specific Gravity (UA) 1.017  Blood (UA) Negative  pH (UA) 7.0  Protein (UA) Negative  Nitrite (UA) Positive  Leukocyte Esterase (UA) 3+ (Result(s) reported on 25 Jun 2012 at 02:35PM.)  RBC (UA) 2 /HPF  WBC (UA) 7 /HPF  Bacteria (UA) 2+  Epithelial Cells (UA) 2 /HPF  Mucous (UA) PRESENT (Result(s) reported on 25 Jun 2012 at 02:35PM.)  Routine Hem:  01-Mar-14 13:55   WBC (CBC)  15.8  RBC (CBC) 4.40  Hemoglobin (CBC) 14.4  Hematocrit (CBC) 42.6  Platelet Count (CBC) 243 (Result(s) reported on 25 Jun 2012 at 02:07PM.)  MCV 97  MCH 32.8  MCHC 33.9  RDW 12.3   Radiology Results: LabUnknown:    01-Mar-14 20:56, CT Abdomen and Pelvis With Contrast  PACS Image  CT:  CT Abdomen and Pelvis With Contrast  REASON FOR EXAM:    (1) abd pain; (2) pel pain  COMMENTS:       PROCEDURE: CT  - CT ABDOMEN / PELVIS  W  - Jun 25 2012  8:56PM     RESULT: History: Abdominal pain    Comparison:  None  Technique: Multiple axial images of the abdomen and pelvis wereperformed   from the lung bases to the pubic symphysis, with p.o. contrast and with   80 ml of Isovue 370 intravenous contrast.    Findings:  The lung bases are clear. There is no pneumothorax. The heart size is   normal.     The liver demonstrates no focal abnormality. There is no intrahepatic or   extrahepatic biliary ductal dilatation. The gallbladder is unremarkable.   The spleen demonstrates no focal abnormality. There is a jumping right   renal calculus. The left kidney, adrenal glands, andpancreas are normal.   The bladder is unremarkable.     The stomach, duodenum, small intestine, and large intestine demonstrate   no contrast  extravasation or dilatation. The appendix is dilated   measuring 9 mm in diameter, with periappendiceal inflammatory changes   most concerning for acute appendicitis. There is no pneumoperitoneum,   pneumatosis, or portal venous gas. There is a small amount of pelvic free   fluid. There is no lymphadenopathy.   The abdominal aorta is normal in caliber .    The osseous structures are unremarkable.    IMPRESSION:     1. Dilated appendix with periappendiceal inflammatory changes most   concerning for acute appendicitis.    These findings were communicated to Dr. Benjaman Lobe on 06/25/2012 at 2110   hours.    Dictation Site: 1      Verified By: Jennette Banker, M.D., MD    Assessment/Admission Diagnosis 24 y/o with 36 hours abdominal pain clinical exam and hx consistent with acute appendicitis, no signs of perforation.   Plan adm it, lap appy, IV invanz. discussed with patient and grandmother the procedure and all questions addressed.   Electronic Signatures: Sherri Rad (MD)  (Signed 01-Mar-14 22:15)  Authored: CHIEF COMPLAINT and HISTORY, PAST MEDICAL/SURGIAL HISTORY, ALLERGIES, Other Allergies, FAMILY AND SOCIAL HISTORY, REVIEW OF SYSTEMS, PHYSICAL EXAM, LABS, Radiology, ASSESSMENT AND PLAN   Last Updated: 01-Mar-14 22:15 by Sherri Rad (MD)

## 2014-08-17 NOTE — Op Note (Signed)
PATIENT NAME:  Julie DeedsIARROT, Sabrena MR#:  [id number] DATE OF BIRTH:  [birth year]  DATE OF PROCEDURE:  06/26/2012  PREOPERATIVE DIAGNOSIS: Acute appendicitis.  POSTOPERATIVE DIAGNOSIS: Acute appendicitis.  PROCEDURE PERFORMED: Laparoscopic appendectomy.   SPECIMENS: Appendix.   ESTIMATED BLOOD LOSS: 25 mL.   DRAINS: None.   LAP AND NEEDLE COUNT: Correct x 2.   DESCRIPTION OF PROCEDURE: With the patient in the supine position, general oral endotracheal anesthesia was induced. The left arm was padded and tucked at her side. Her abdomen was prepped and draped with ChloraPrep solution. Timeout was observed.   The previous infraumbilical scar was opened with scalpel in vertical orientation. A 12 mm blunt Hassan trocar was placed direct visualization with stay sutures being passed through the fascia. Pneumoperitoneum was established. A 5 mm trocar was placed in the right upper quadrant, a 5 mm trocar in the left lower quadrant. The appendix was adherent to the terminal ileum. This was separated with blunt technique. This was elevated towards the anterior abdominal wall. The mesoappendix was divided between two fires of the endovascular GIA stapler with white load application after exchanging the right upper quadrant trocar site to an 11 mm trocar site due to angling. With the mesoappendix divided, the base of the appendix was clearly identified at the confluence of the tinea and was divided with a single fire of the Endo GIA with blue load application. The specimen was captured in an Endo Catch device and retrieved. Pneumoperitoneum was then re-established. The right lower quadrant was irrigated with a total of 1 L of normal saline. Hemostasis was obtained with point cautery at bleeding points along the peritoneum. Hemostasis being ensured on the operative field, ports were then removed under direct visualization. The infraumbilical fascial defect being reapproximated with an additional figure-of-eight #0 Vicryl  suture. Skin edges were reapproximated utilizing a combination of 3-0 Monocryl. 4-0 nylon in simple and vertical mattress orientation was used to reapproximate skin edges of the umbilical incision through the scar. Sterile occlusive dressings were placed consisting of Steri-Strips, Telfa, and Tegaderm. The patient was then subsequently extubated and taken to the recovery room in stable and satisfactory condition by anesthesia services.   ____________________________ Redge GainerMark A. Egbert GaribaldiBird, MD mab:aw D: 06/26/2012 00:50:00 ET T: 06/26/2012 07:02:24 ET JOB#: [id number]  cc: Loraine LericheMark A. Egbert GaribaldiBird, MD, <Dictator> Raynald KempMARK A Maddisyn Hegwood MD ELECTRONICALLY SIGNED 06/26/2012 18:06

## 2014-08-31 ENCOUNTER — Encounter: Payer: Self-pay | Admitting: Physician Assistant

## 2014-08-31 ENCOUNTER — Ambulatory Visit (INDEPENDENT_AMBULATORY_CARE_PROVIDER_SITE_OTHER): Payer: 59 | Admitting: Physician Assistant

## 2014-08-31 VITALS — BP 105/70 | HR 90 | Temp 97.9°F | Wt 120.0 lb

## 2014-08-31 DIAGNOSIS — F4001 Agoraphobia with panic disorder: Secondary | ICD-10-CM | POA: Diagnosis not present

## 2014-08-31 DIAGNOSIS — N760 Acute vaginitis: Secondary | ICD-10-CM | POA: Diagnosis not present

## 2014-08-31 DIAGNOSIS — F321 Major depressive disorder, single episode, moderate: Secondary | ICD-10-CM | POA: Diagnosis not present

## 2014-08-31 DIAGNOSIS — B9689 Other specified bacterial agents as the cause of diseases classified elsewhere: Secondary | ICD-10-CM

## 2014-08-31 DIAGNOSIS — A499 Bacterial infection, unspecified: Secondary | ICD-10-CM | POA: Diagnosis not present

## 2014-08-31 MED ORDER — DIAZEPAM 2 MG PO TABS: 2.0000 mg | ORAL_TABLET | Freq: Two times a day (BID) | ORAL | Status: DC | PRN

## 2014-08-31 MED ORDER — PAROXETINE HCL 20 MG PO TABS: ORAL_TABLET | ORAL | Status: DC

## 2014-08-31 MED ORDER — METRONIDAZOLE 500 MG PO TABS: 500.0000 mg | ORAL_TABLET | Freq: Two times a day (BID) | ORAL | Status: DC

## 2014-08-31 NOTE — Progress Notes (Signed)
    Patient presents to clinic today for anxiety.  Has stopped Zoloft due to chronic nausea.  Anxiety still present but improving.  Denies recurrence of panic attacks.  Endorses depressed mood off of the sertraline.  Denies SI/HI. Is still seeing counseling once monthly.  Past Medical History  Diagnosis Date  . Depression   . Anxiety   . Fainting spell   . Frequent headaches   . Migraines   . Endometriosis   . Hormone imbalance     Current Outpatient Prescriptions on File Prior to Visit  Medication Sig Dispense Refill  . Multiple Vitamins-Minerals (MULTIVITAMIN GUMMIES ADULT) CHEW Chew by mouth daily.    . norethindrone-ethinyl estradiol (JUNEL FE,GILDESS FE,LOESTRIN FE) 1-20 MG-MCG tablet Take 1 tablet by mouth daily.    . vitamin B-12 (CYANOCOBALAMIN) 1000 MCG tablet Take 1,000 mcg by mouth daily.     No current facility-administered medications on file prior to visit.    Allergies  Allergen Reactions  . Codeine Nausea Only  . Mirena [Levonorgestrel]     Damage to Uterus    Family History  Problem Relation Age of Onset  . Alcoholism Father     Deceased  . Liver cancer Father   . Cirrhosis Father   . Lung cancer Father   . Thyroid disease Mother     Living  . Anxiety disorder Mother   . Breast cancer Paternal Grandmother   . Heart disease Maternal Grandfather   . Thyroid disease Maternal Grandmother   . Aneurysm Maternal Aunt     Brain  . Colon cancer Paternal Uncle   . Hyperlipidemia Brother   . ADD / ADHD Brother     History   Social History  . Marital Status: Single    Spouse Name: N/A  . Number of Children: N/A  . Years of Education: N/A   Social History Main Topics  . Smoking status: Never Smoker   . Smokeless tobacco: Never Used  . Alcohol Use: 0.0 oz/week    0 Standard drinks or equivalent per week     Comment: rare  . Drug Use: Yes     Comment: rare Marijuana  . Sexual Activity:    Partners: Male    Birth Control/ Protection: Pill    Other Topics Concern  . None   Social History Narrative   Review of Systems - See HPI.  All other ROS are negative.  BP 105/70 mmHg  Pulse 90  Temp(Src) 97.9 F (36.6 C)  Wt 120 lb (54.432 kg)  SpO2 98%  Physical Exam  Constitutional: She is oriented to person, place, and time and well-developed, well-nourished, and in no distress.  HENT:  Head: Normocephalic and atraumatic.  Eyes: Conjunctivae are normal.  Cardiovascular: Normal rate, regular rhythm, normal heart sounds and intact distal pulses.   Pulmonary/Chest: Effort normal and breath sounds normal. No respiratory distress. She has no wheezes. She has no rales. She exhibits no tenderness.  Neurological: She is alert and oriented to person, place, and time.  Skin: Skin is warm and dry. No rash noted.  Psychiatric: Affect normal.  Vitals reviewed.  Assessment/Plan: Bacterial vaginosis Continue probiotic. Will send in oral Flagyl BID x 7 days.  Follow-up with gynecology if not improving.   Major depressive disorder, single episode, moderate Cannot tolerate Zoloft.  Will begin Paxil 10 mg daily x 1 week.  Increase to 20 mg daily.  Continue Valium.

## 2014-08-31 NOTE — Progress Notes (Signed)
Pre visit review using our clinic review tool, if applicable. No additional management support is needed unless otherwise documented below in the visit note. 

## 2014-08-31 NOTE — Assessment & Plan Note (Signed)
Continue probiotic. Will send in oral Flagyl BID x 7 days.  Follow-up with gynecology if not improving.

## 2014-08-31 NOTE — Assessment & Plan Note (Signed)
Cannot tolerate Zoloft.  Will begin Paxil 10 mg daily x 1 week.  Increase to 20 mg daily.  Continue Valium.

## 2014-08-31 NOTE — Patient Instructions (Signed)
Please stop Zoloft and begin Paxil as directed. Follow-up in 2 months.  Return sooner.  For vaginal infection, take Flagyl as directed and continue probiotic.  If not resolving, would recommend you see Gynecology.

## 2014-09-26 ENCOUNTER — Telehealth: Payer: Self-pay | Admitting: Physician Assistant

## 2014-09-26 DIAGNOSIS — Z7689 Persons encountering health services in other specified circumstances: Secondary | ICD-10-CM

## 2014-09-26 NOTE — Telephone Encounter (Signed)
Caller name: Benancio DeedsKasey Blaschke Relationship to patient: self Can be reached: [phone] Pharmacy:  Reason for call: Pt would like referral to GYN.

## 2014-09-28 NOTE — Telephone Encounter (Signed)
Done

## 2014-10-03 ENCOUNTER — Other Ambulatory Visit: Payer: Self-pay | Admitting: Obstetrics and Gynecology

## 2014-10-05 LAB — CYTOLOGY - PAP

## 2014-10-11 ENCOUNTER — Ambulatory Visit (INDEPENDENT_AMBULATORY_CARE_PROVIDER_SITE_OTHER): Payer: Managed Care, Other (non HMO) | Admitting: Licensed Clinical Social Worker

## 2014-10-11 DIAGNOSIS — F321 Major depressive disorder, single episode, moderate: Secondary | ICD-10-CM | POA: Diagnosis not present

## 2014-10-18 ENCOUNTER — Ambulatory Visit: Payer: 59 | Admitting: Licensed Clinical Social Worker

## 2014-10-29 IMAGING — CT CT ABD-PELV W/ CM
1 of 2 series · 15 of 32 positions shown, 19 images · IV contrast (isovue)
Comparison: None

REASON FOR EXAM: (1) abd pain; (2) pel pain
COMMENTS:

PROCEDURE:     CT  - CT ABDOMEN / PELVIS  W  - June 25, 2012  [DATE]
RESULT:     History: Abdominal pain
TECHNIQUE: Multiple axial images of the abdomen and pelvis were performed
from the lung bases to the pubic symphysis, with p.o. contrast and with 80
ml of Isovue 370 intravenous contrast.

[Series 2: 3mm soft tissue · axial · 0.62mm/px · z∈[-452,-60]mm · 15 of 143 slices shown, 19 images]
[im 6/143  soft-tissue]
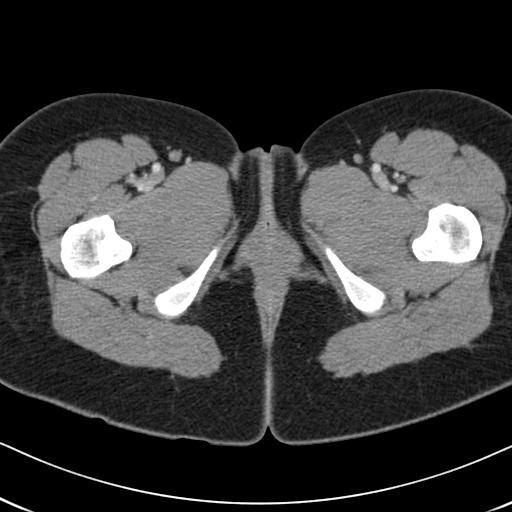
[im 6/143  bone]
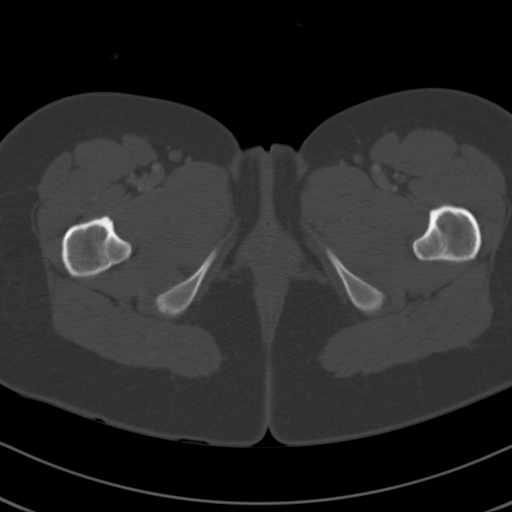
[im 18/143  soft-tissue]
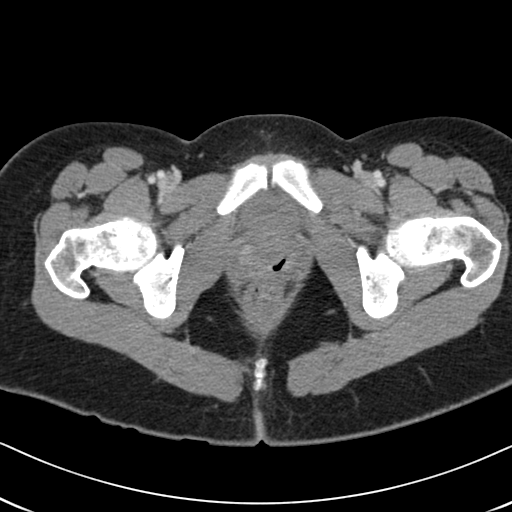
[im 30/143  soft-tissue]
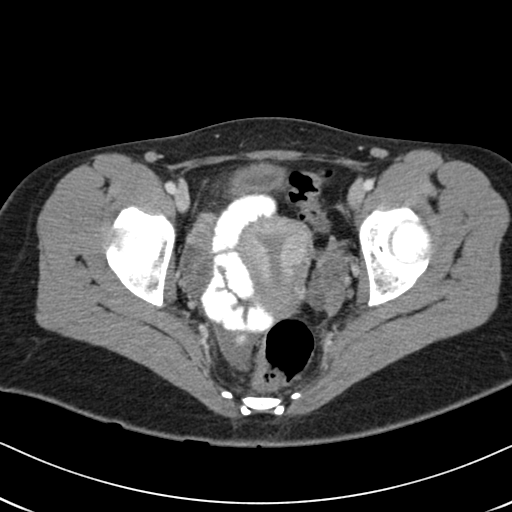
[im 42/143  soft-tissue]
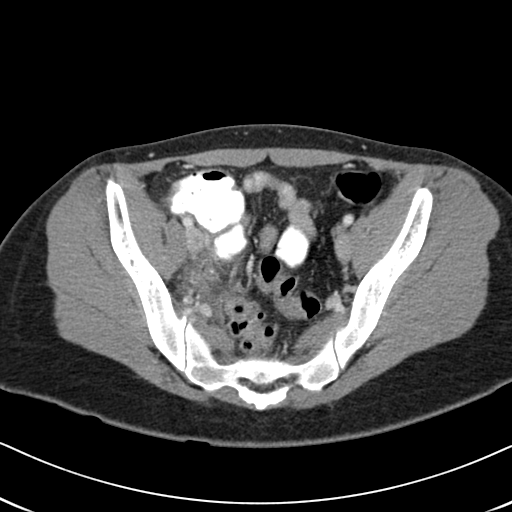
[im 48/143  soft-tissue]
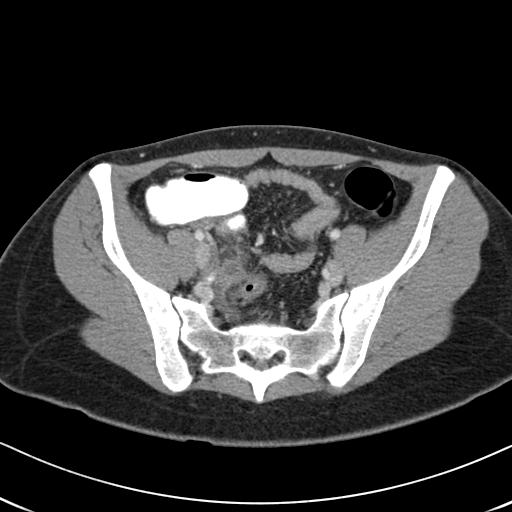
[im 60/143  soft-tissue]
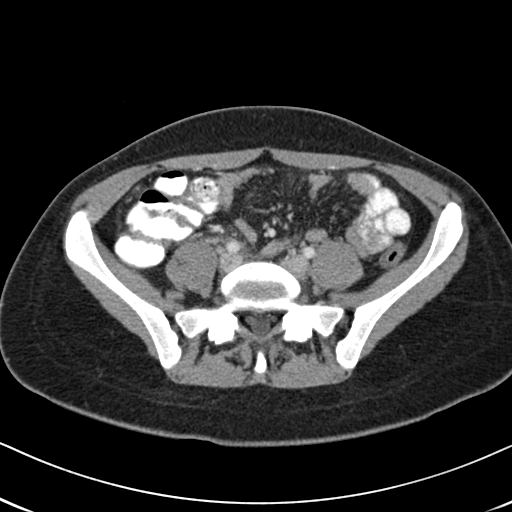
[im 72/143  soft-tissue]
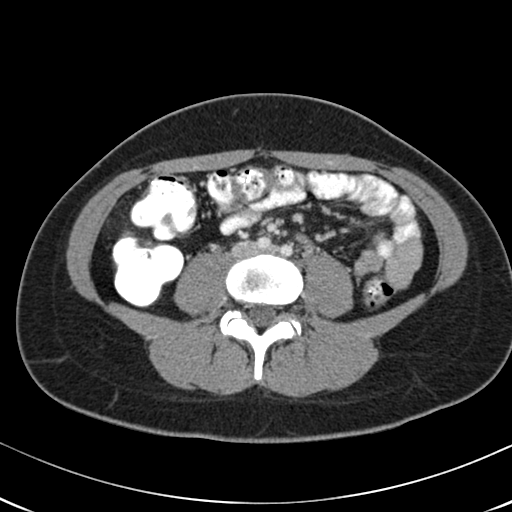
[im 83/143  soft-tissue]
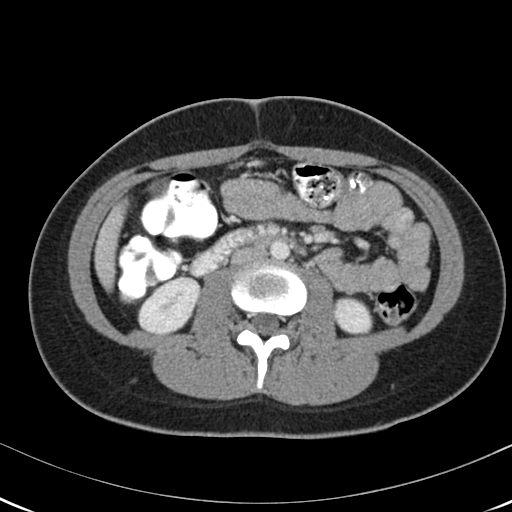
[im 95/143  soft-tissue]
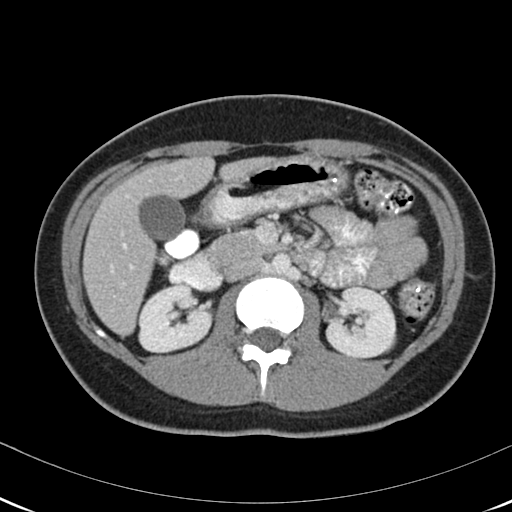
[im 95/143  bone]
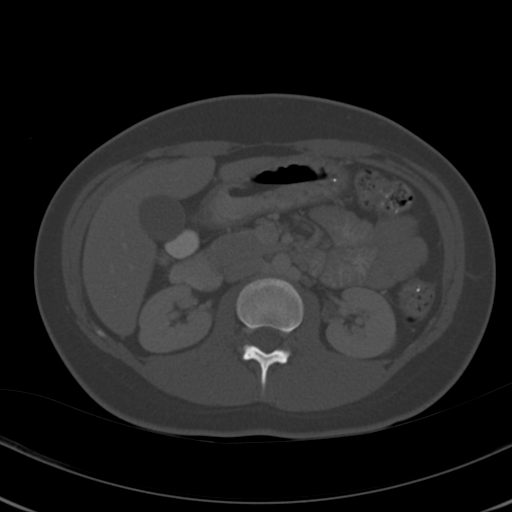
[im 101/143  soft-tissue]
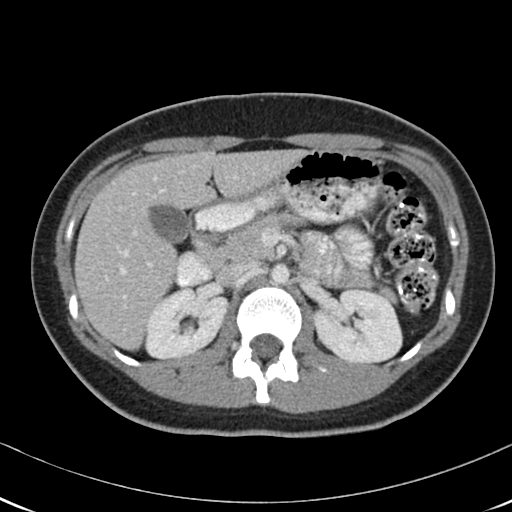
[im 113/143  soft-tissue]
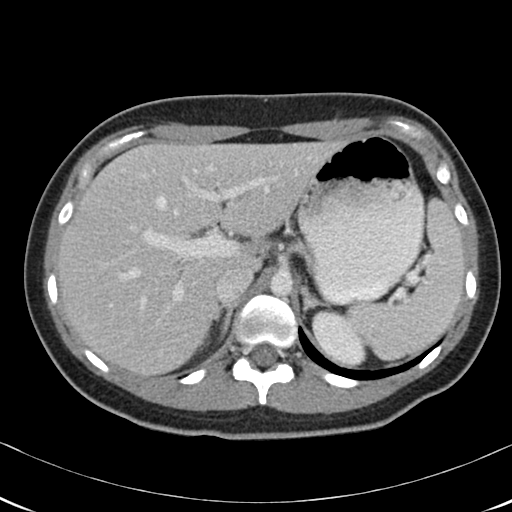
[im 119/143  lung]
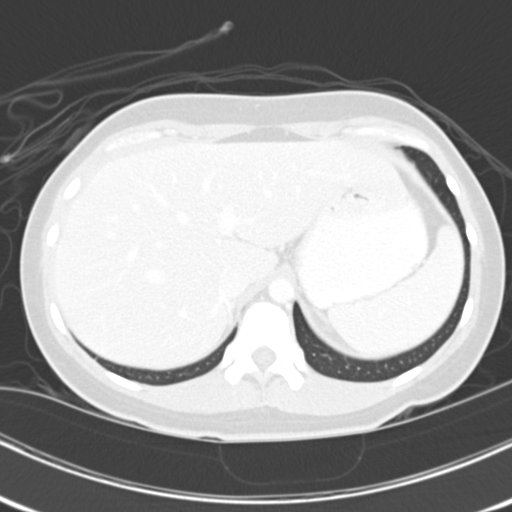
[im 125/143  soft-tissue]
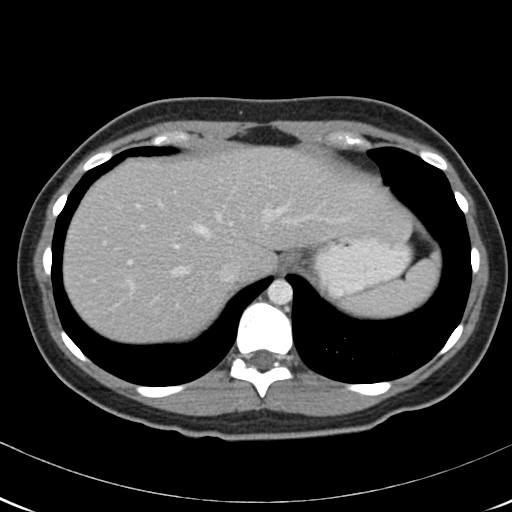
[im 125/143  lung]
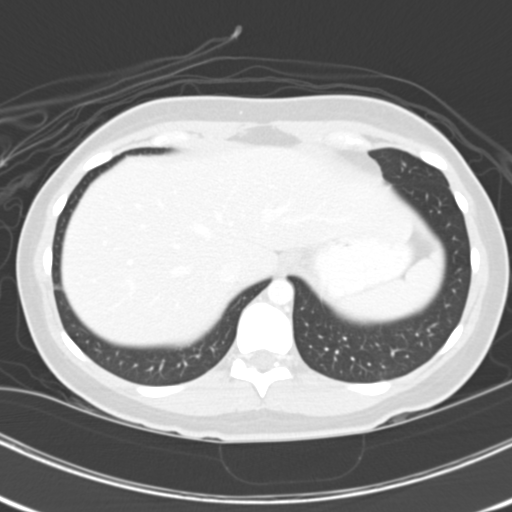
[im 131/143  lung]
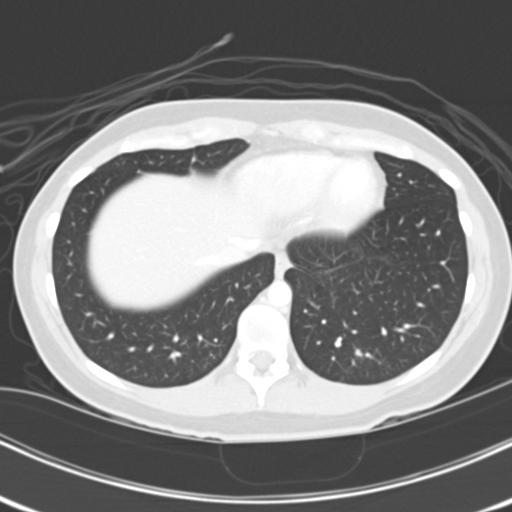
[im 137/143  soft-tissue]
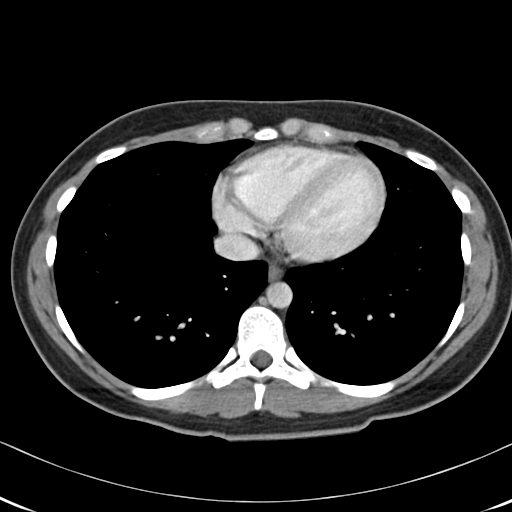
[im 137/143  lung]
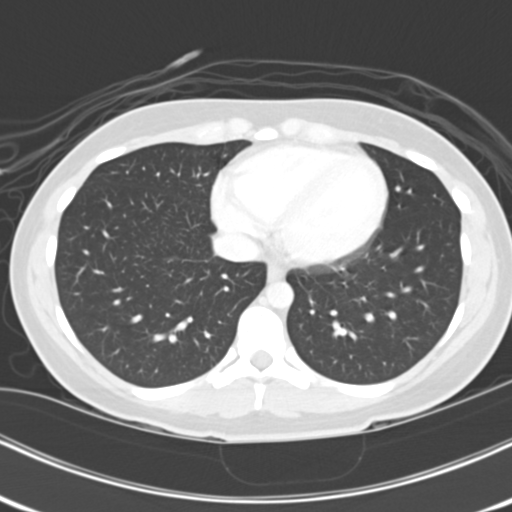

[15 of 32 positions shown; findings below may reference images not displayed]

FINDINGS: The lung bases are clear. There is no pneumothorax. The heart size is
normal.

The liver demonstrates no focal abnormality. There is no intrahepatic or
extrahepatic biliary ductal dilatation. The gallbladder is unremarkable. The
spleen demonstrates no focal abnormality. There is a jumping right renal
calculus. The left kidney, adrenal glands, and pancreas are normal. The
bladder is unremarkable.

The stomach, duodenum, small intestine, and large intestine demonstrate no
contrast extravasation or dilatation. The appendix is dilated measuring 9 mm
in diameter, with periappendiceal inflammatory changes most concerning for
acute appendicitis. There is no pneumoperitoneum, pneumatosis, or portal
venous gas. There is a small amount of pelvic free fluid. There is no
lymphadenopathy.

The abdominal aorta is normal in caliber .

The osseous structures are unremarkable.
IMPRESSION: 1. Dilated appendix with periappendiceal inflammatory changes most
concerning for acute appendicitis.

These findings were communicated to Dr. B[place] on 06/25/2012 at 2448 hours.

[REDACTED]

## 2014-11-05 ENCOUNTER — Ambulatory Visit: Payer: 59 | Admitting: Physician Assistant

## 2014-11-06 ENCOUNTER — Encounter: Payer: Self-pay | Admitting: Physician Assistant

## 2014-11-06 ENCOUNTER — Telehealth: Payer: Self-pay | Admitting: Physician Assistant

## 2014-11-06 NOTE — Telephone Encounter (Signed)
Pt was no show 11/05/14 9:15am, follow up 15 appt, pt has not rescheduled, mailing letter, charge for no show?

## 2014-11-07 NOTE — Telephone Encounter (Signed)
Charge as she has also had previous last minute cancellations.

## 2015-02-20 ENCOUNTER — Ambulatory Visit (INDEPENDENT_AMBULATORY_CARE_PROVIDER_SITE_OTHER): Payer: 59 | Admitting: Physician Assistant

## 2015-02-20 ENCOUNTER — Encounter: Payer: Self-pay | Admitting: Physician Assistant

## 2015-02-20 VITALS — BP 112/61 | HR 75 | Temp 98.2°F | Resp 16 | Ht 63.5 in | Wt 128.2 lb

## 2015-02-20 DIAGNOSIS — H1089 Other conjunctivitis: Secondary | ICD-10-CM

## 2015-02-20 DIAGNOSIS — F4001 Agoraphobia with panic disorder: Secondary | ICD-10-CM

## 2015-02-20 DIAGNOSIS — A499 Bacterial infection, unspecified: Secondary | ICD-10-CM | POA: Diagnosis not present

## 2015-02-20 DIAGNOSIS — F321 Major depressive disorder, single episode, moderate: Secondary | ICD-10-CM | POA: Diagnosis not present

## 2015-02-20 DIAGNOSIS — H109 Unspecified conjunctivitis: Secondary | ICD-10-CM

## 2015-02-20 MED ORDER — ERYTHROMYCIN 5 MG/GM OP OINT: 1.0000 "application " | TOPICAL_OINTMENT | Freq: Three times a day (TID) | OPHTHALMIC | Status: DC

## 2015-02-20 MED ORDER — DIAZEPAM 2 MG PO TABS
2.0000 mg | ORAL_TABLET | Freq: Two times a day (BID) | ORAL | Status: DC | PRN
Start: 2015-02-20 — End: 2015-06-10

## 2015-02-20 NOTE — Patient Instructions (Signed)
Please follow the hygiene measures discussed at your visit. Apply the antibiotic ointment as directed. Continue the lubricating drops. Follow-up if symptoms are not resolving or if anything worsens.

## 2015-02-20 NOTE — Progress Notes (Signed)
Pre visit review using our clinic review tool, if applicable. No additional management support is needed unless otherwise documented below in the visit note/SLS  

## 2015-02-22 DIAGNOSIS — H109 Unspecified conjunctivitis: Secondary | ICD-10-CM | POA: Insufficient documentation

## 2015-02-22 NOTE — Assessment & Plan Note (Signed)
Rx Romycin ointment. Lubricating eye drops recommended. Hygiene measures discussed. Follow-up PRN if symptoms are not resolving.

## 2015-02-22 NOTE — Progress Notes (Signed)
Patient presents to clinic today c/o redness of R eye with discharge and watery eyes x 1 day. Denies fever, chills or URI symptoms. Endorses recent exposure to pink eye.  Past Medical History  Diagnosis Date  . Depression   . Anxiety   . Fainting spell   . Frequent headaches   . Migraines   . Endometriosis   . Hormone imbalance     Current Outpatient Prescriptions on File Prior to Visit  Medication Sig Dispense Refill  . Multiple Vitamins-Minerals (MULTIVITAMIN GUMMIES ADULT) CHEW Chew by mouth daily.    . norethindrone-ethinyl estradiol (JUNEL FE,GILDESS FE,LOESTRIN FE) 1-20 MG-MCG tablet Take 1 tablet by mouth daily.    . vitamin B-12 (CYANOCOBALAMIN) 1000 MCG tablet Take 1,000 mcg by mouth daily.     No current facility-administered medications on file prior to visit.    Allergies  Allergen Reactions  . Codeine Nausea Only  . Mirena [Levonorgestrel]     Damage to Uterus    Family History  Problem Relation Age of Onset  . Alcoholism Father     Deceased  . Liver cancer Father   . Cirrhosis Father   . Lung cancer Father   . Thyroid disease Mother     Living  . Anxiety disorder Mother   . Breast cancer Paternal Grandmother   . Heart disease Maternal Grandfather   . Thyroid disease Maternal Grandmother   . Aneurysm Maternal Aunt     Brain  . Colon cancer Paternal Uncle   . Hyperlipidemia Brother   . ADD / ADHD Brother     Social History   Social History  . Marital Status: Single    Spouse Name: N/A  . Number of Children: N/A  . Years of Education: N/A   Social History Main Topics  . Smoking status: Never Smoker   . Smokeless tobacco: Never Used  . Alcohol Use: 0.0 oz/week    0 Standard drinks or equivalent per week     Comment: rare  . Drug Use: Yes     Comment: rare Marijuana  . Sexual Activity:    Partners: Male    Birth Control/ Protection: Pill   Other Topics Concern  . None   Social History Narrative   Review of Systems - See HPI.   All other ROS are negative.  BP 112/61 mmHg  Pulse 75  Temp(Src) 98.2 F (36.8 C) (Oral)  Resp 16  Ht 5' 3.5" (1.613 m)  Wt 128 lb 4 oz (58.174 kg)  BMI 22.36 kg/m2  SpO2 100%  Physical Exam  Constitutional: She is oriented to person, place, and time and well-developed, well-nourished, and in no distress.  HENT:  Head: Normocephalic and atraumatic.  Eyes: Pupils are equal, round, and reactive to light. Right eye exhibits discharge. Right conjunctiva is injected. Right conjunctiva has no hemorrhage.  Neck: Neck supple.  Cardiovascular: Normal rate, regular rhythm, normal heart sounds and intact distal pulses.   Pulmonary/Chest: Effort normal and breath sounds normal. No respiratory distress. She has no wheezes. She has no rales. She exhibits no tenderness.  Neurological: She is alert and oriented to person, place, and time.  Skin: Skin is warm and dry. No rash noted.  Psychiatric: Affect normal.  Vitals reviewed.  No results found for this or any previous visit (from the past 2160 hour(s)).  Assessment/Plan: Bacterial conjunctivitis of right eye Rx Romycin ointment. Lubricating eye drops recommended. Hygiene measures discussed. Follow-up PRN if symptoms are not resolving.

## 2015-02-25 ENCOUNTER — Telehealth: Payer: Self-pay | Admitting: Physician Assistant

## 2015-02-25 MED ORDER — NEOMYCIN-POLYMYXIN-HC 3.5-10000-1 OP SUSP: OPHTHALMIC | Status: DC

## 2015-02-25 NOTE — Telephone Encounter (Signed)
Patient informed, understood & agreed to start new medication, Rx to pharmacy/[facility]

## 2015-02-25 NOTE — Telephone Encounter (Signed)
Caller name: Julie DeedsKasey Blackwell   Relationship to patient: Self  Can be reached: [phone]  Pharmacy:  Reason for call: Pt was seen for eye irritation. Pt says that her eyes are still red and would like to know if there is something that can be done for the redness.   Please advise.

## 2015-02-25 NOTE — Telephone Encounter (Signed)
She was given Erythromycin ointment to use TID. Make sure she is using as directed. If so, ok to tell her to stop. Will send in Cortisporin Ophthalmic solution.  1-2 drops to affected eye every 4 hours for 5 days. If not improving she will need reassessment.

## 2015-04-04 ENCOUNTER — Telehealth: Payer: Self-pay | Admitting: Physician Assistant

## 2015-04-04 NOTE — Telephone Encounter (Signed)
LM to schedule flu shot or update records. °

## 2015-05-30 ENCOUNTER — Telehealth: Payer: Self-pay | Admitting: Physician Assistant

## 2015-05-30 NOTE — Telephone Encounter (Signed)
LM to schedule flu shot and CPE (last unknown)

## 2015-06-10 ENCOUNTER — Telehealth: Payer: Self-pay | Admitting: Physician Assistant

## 2015-06-10 DIAGNOSIS — F4001 Agoraphobia with panic disorder: Secondary | ICD-10-CM

## 2015-06-10 DIAGNOSIS — F321 Major depressive disorder, single episode, moderate: Secondary | ICD-10-CM

## 2015-06-10 MED ORDER — DIAZEPAM 2 MG PO TABS: 2.0000 mg | ORAL_TABLET | Freq: Two times a day (BID) | ORAL | Status: DC | PRN

## 2015-06-10 NOTE — Telephone Encounter (Signed)
Relation to ZO:XWRU Call back [phone] Pharmacy: [facility] [address] [place], [place], [place] [address] [phone]    Reason for call:  Patient requesting a refill diazepam (VALIUM) 2 MG tablet , patient is out of town due to work and would like Rx sent to [place]

## 2015-06-10 NOTE — Telephone Encounter (Signed)
Requesting Valium -Take 1 tablet by mouth every 12 hours as needed for anxiety. Last refill:02/20/15;#30,0 Last OV:02/20/15 No UDS done Please advise.//AB/CMA

## 2015-06-10 NOTE — Telephone Encounter (Signed)
[facility] [place] [facility]) -Valium -Take 1 tablet by mouth every 12 hours as needed for anxiety # 30,0 and pt needs OV for further refills.//AB/CMA

## 2015-06-10 NOTE — Telephone Encounter (Signed)
Ok to phone in refill same quantity with 0 additional refills. She will need OV for further refills

## 2016-10-01 ENCOUNTER — Ambulatory Visit
Admission: EM | Admit: 2016-10-01 | Discharge: 2016-10-01 | Disposition: A | Discharge status: Home / Self Care | Payer: 59 | Attending: Emergency Medicine | Admitting: Emergency Medicine

## 2016-10-01 DIAGNOSIS — B360 Pityriasis versicolor: Secondary | ICD-10-CM

## 2016-10-01 MED ORDER — KETOCONAZOLE 2 % EX CREA: 1.0000 "application " | TOPICAL_CREAM | Freq: Every day | CUTANEOUS | 1 refills | Status: DC

## 2016-10-01 MED ORDER — SELENIUM SULFIDE 2.5 % EX LOTN: 1.0000 "application " | TOPICAL_LOTION | Freq: Every day | CUTANEOUS | 2 refills | Status: DC

## 2016-10-01 MED ORDER — FLUCONAZOLE 150 MG PO TABS: 300.0000 mg | ORAL_TABLET | ORAL | 0 refills | Status: DC

## 2016-10-01 NOTE — ED Triage Notes (Addendum)
Pt states that she has a polka dot rash that is white pea size spots all over her abdomen, she noticed it Sunday.

## 2016-10-01 NOTE — ED Provider Notes (Signed)
HPI  SUBJECTIVE:  Julie Blackwell is a 26 y.o. female who presents with a flat, hypopigmented rash on her anterior torso starting 4 days ago. States it is "moving up her chest". She denies itching, burning, pain. denies body aches, nausea, fevers, crusting. She tried hydrocortisone without improvement in her symptoms. There are no aggravating or alleviating factors. She has a past medical history of anxiety, endometriosis. No history of tinea, vitiligo, diabetes, hypertension, pityriasis torso. LMP: Now. PMD: None.    Past Medical History:  Diagnosis Date  . Anxiety   . Depression   . Endometriosis   . Fainting spell   . Frequent headaches   . Hormone imbalance   . Migraines     Past Surgical History:  Procedure Laterality Date  . APPENDECTOMY  2014  . LAPAROSCOPY     x2    Family History  Problem Relation Age of Onset  . Alcoholism Father        Deceased  . Liver cancer Father   . Cirrhosis Father   . Lung cancer Father   . Thyroid disease Mother        Living  . Anxiety disorder Mother   . Breast cancer Paternal Grandmother   . Heart disease Maternal Grandfather   . Thyroid disease Maternal Grandmother   . Aneurysm Maternal Aunt        Brain  . Colon cancer Paternal Uncle   . Hyperlipidemia Brother   . ADD / ADHD Brother     Social History  Substance Use Topics  . Smoking status: Never Smoker  . Smokeless tobacco: Never Used  . Alcohol use 0.0 oz/week     Comment: rare    No current facility-administered medications for this encounter.   Current Outpatient Prescriptions:  .  fluconazole (DIFLUCAN) 150 MG tablet, Take 2 tablets (300 mg total) by mouth once a week. For 2 weeks, Disp: 4 tablet, Rfl: 0 .  ketoconazole (NIZORAL) 2 % cream, Apply 1 application topically daily. Apply daily for 10-21 days, Disp: 30 g, Rfl: 1 .  selenium sulfide (SELSUN) 2.5 % shampoo, Apply 1 application topically daily. Apply for 10 minutes on skin daily for 1-4 weeks or one  overnight application, Disp: 118 mL, Rfl: 2  Allergies  Allergen Reactions  . Codeine Nausea Only  . Mirena [Levonorgestrel]     Damage to Uterus     ROS  As noted in HPI.   Physical Exam  BP 115/78 (BP Location: Left Arm)   Pulse 73   Temp 98.4 F (36.9 C) (Oral)   Resp 18   Ht 5\' 2"  (1.575 m)   Wt 135 lb (61.2 kg)   LMP 10/01/2016   SpO2 100%   BMI 24.69 kg/m   Constitutional: Well developed, well nourished, no acute distress Eyes:  EOMI, conjunctiva normal bilaterally HENT: Normocephalic, atraumatic,mucus membranes moist Respiratory: Normal inspiratory effort Cardiovascular: Normal rate GI: nondistended skin: , Soft, nontender hypopigmented circular rash over her anterior torso. There is no rash anywhere else. No crusting. No skin peeling. See picture       Musculoskeletal: no deformities Neurologic: Alert & oriented x 3, no focal neuro deficits Psychiatric: Speech and behavior appropriate   ED Course   Medications - No data to display  No orders of the defined types were placed in this encounter.   No results found for this or any previous visit (from the past 24 hour(s)). No results found.  ED Clinical Impression  Tinea versicolor  ED Assessment/Plan Presentation consistent with tinea versicolor. We'll send home with selenium sulfide lotion 2.5 % apply  for 10 minutes daily for 1-4 weeks or one overnight application, ketoconazole 2% cream once daily for 10-21 days. If this continues will send home with a prescription of fluconazole 300 mg 1 day per week for 2 weeks.  Also providing primary care referral for routine care.   Discussed  MDM, plan and followup with patient.  Patient agrees with plan.   Meds ordered this encounter  Medications  . selenium sulfide (SELSUN) 2.5 % shampoo    Sig: Apply 1 application topically daily. Apply for 10 minutes on skin daily for 1-4 weeks or one overnight application    Dispense:  118 mL    Refill:  2    . ketoconazole (NIZORAL) 2 % cream    Sig: Apply 1 application topically daily. Apply daily for 10-21 days    Dispense:  30 g    Refill:  1  . fluconazole (DIFLUCAN) 150 MG tablet    Sig: Take 2 tablets (300 mg total) by mouth once a week. For 2 weeks    Dispense:  4 tablet    Refill:  0    *This clinic note was created using Scientist, clinical (histocompatibility and immunogenetics)Dragon dictation software. Therefore, there may be occasional mistakes despite careful proofreading.  ?   Domenick GongMortenson, Taiyana Kissler, MD 10/01/16 1429

## 2016-10-01 NOTE — Discharge Instructions (Signed)
selenium sulfide lotion 2.5 % apply for 10 minutes daily for 1-4 weeks or for one overnight application, ketoconazole 2% cream once daily for 10-21 days. If your rash does not resolve, try the  prescription of fluconazole 300 mg 1 day per week for 2 weeks. Sometimes going out into the sun can help with this.  Here is a list of primary care providers who are taking new patients:  Dr. Elizabeth Sauereanna Jones, Dr. Schuyler AmorWilliam Plonk [address] [place] [address] [phone]  Dr. Everlene OtherJayce Cook [address]  [address]  [place] [address]  [phone]  [facility] [place] [address] [place] [address]  [place] [address]  [phone]  [facility] [address]  [place], [place] [address] [phone]  [facility] [place] [address]  [phone] [place], [place] [address]

## 2016-10-19 ENCOUNTER — Encounter: Payer: Self-pay | Admitting: Family

## 2016-10-19 ENCOUNTER — Ambulatory Visit (INDEPENDENT_AMBULATORY_CARE_PROVIDER_SITE_OTHER): Payer: 59 | Admitting: Family

## 2016-10-19 VITALS — BP 100/53 | HR 70 | Temp 98.4°F | Resp 16 | Ht 63.0 in | Wt 140.2 lb

## 2016-10-19 DIAGNOSIS — F418 Other specified anxiety disorders: Secondary | ICD-10-CM | POA: Diagnosis not present

## 2016-10-19 DIAGNOSIS — R21 Rash and other nonspecific skin eruption: Secondary | ICD-10-CM

## 2016-10-19 MED ORDER — ESCITALOPRAM OXALATE 10 MG PO TABS: ORAL_TABLET | ORAL | 0 refills | Status: DC

## 2016-10-19 NOTE — Progress Notes (Signed)
Subjective:    Patient ID: Benancio Deeds, female    DOB: 12-11-1990, 26 y.o.   MRN: [id number]  HPI  Ms. Deasis is a 27 yr old female who presents today with rash.   Reports that she has been running lately.  Reports that the rash is "all over my abdomen." Has been noticeable x 1 month. Not itchy.    Anxiety/Depression- reports that she had a difficult period a few years ago.  Had one bad night with suicidal thoughts several years ago. She saw Berniece Andreas back then. Established with [facility]. Reports that she was so anxious all the time, was having bad panic attacks.  Reports that she feels like her self esteem is very low. HAs been feeling like this for a few months. Trouble focusing and finishing tasks.  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 She moved to [place], grandmother got really sick. She moved in with her grandmother and is her main caretaker.  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     Review of Systems See HPI  Past Medical History:  Diagnosis Date  . Anxiety   . Depression   . Endometriosis   . Fainting spell   . Frequent headaches   . Hormone imbalance   . Migraines      Social History   Social History  . Marital status: Single    Spouse name: N/A  . Number of children: N/A  . Years of education: N/A   Occupational History  . Not on file.   Social History Main Topics  . Smoking status: Never Smoker  . Smokeless tobacco: Never Used  . Alcohol use 0.0 oz/week     Comment: rare  . Drug use: Yes     Comment: rare Marijuana  . Sexual activity: Not Currently    Partners: Male    Birth control/ protection: Pill   Other Topics Concern  . Not on file   Social History Narrative  . No narrative on file    Past Surgical History:  Procedure Laterality Date  . APPENDECTOMY  2014  . LAPAROSCOPY     x2    Family History  Problem Relation Age of Onset  . Alcoholism Father        Deceased  . Liver cancer Father   . Cirrhosis Father   . Lung cancer Father   .  Thyroid disease Mother        Living  . Anxiety disorder Mother   . Breast cancer Paternal Grandmother   . Heart disease Maternal Grandfather   . Thyroid disease Maternal Grandmother   . Aneurysm Maternal Aunt        Brain  . Colon cancer Paternal Uncle   . Hyperlipidemia Brother   . ADD / ADHD Brother     Allergies  Allergen Reactions  . Codeine Nausea Only  . Mirena [Levonorgestrel]     Damage to Uterus    Current Outpatient Prescriptions on File Prior to Visit  Medication Sig Dispense Refill  . fluconazole (DIFLUCAN) 150 MG tablet Take 2 tablets (300 mg total) by mouth once a week. For 2 weeks 4 tablet 0  . ketoconazole (NIZORAL) 2 % cream Apply 1 application topically daily. Apply daily for 10-21 days 30 g 1  . selenium sulfide (SELSUN) 2.5 % shampoo Apply 1 application topically daily. Apply for 10 minutes on skin daily for 1-4 weeks or one overnight application 118 mL 2   No current facility-administered medications on file prior  to visit.     BP (!) 100/53 (BP Location: Right Arm, Cuff Size: Normal)   Pulse 70   Temp 98.4 F (36.9 C) (Oral)   Resp 16   Ht 5\' 3"  (1.6 m)   Wt 140 lb 3.2 oz (63.6 kg)   LMP 10/01/2016   SpO2 100%   BMI 24.84 kg/m       Objective:   Physical Exam  Constitutional: She is oriented to person, place, and time. She appears well-developed and well-nourished. No distress.  Neurological: She is alert and oriented to person, place, and time.  Skin:  Hypopigmented patches noted on abdomen  Psychiatric: Her behavior is normal. Judgment and thought content normal.  Slightly flat affect noted          Assessment & Plan:  Depression/anxiety- trial of lexapro 10mg . I instructed pt to start 1/2 tablet once daily for 1 week and then increase to a full tablet once daily on week two as tolerated. She is instructed that in the rare event of suicidal ideation she should call [phone]. Pt verbalizes understanding.  Plan follow up in 1 month to  evaluate progress.    Skin rash- it really looks like tinea corporis but it is not responding to the typical treatments. I will refer her to dermatology for further evaluation.

## 2016-10-19 NOTE — Patient Instructions (Addendum)
Please begin lexapro 10mg . You will be contacted about your referral to dermatology.

## 2016-11-15 ENCOUNTER — Other Ambulatory Visit: Payer: Self-pay | Admitting: Family

## 2016-11-16 ENCOUNTER — Ambulatory Visit (INDEPENDENT_AMBULATORY_CARE_PROVIDER_SITE_OTHER): Payer: 59 | Admitting: Family

## 2016-11-16 ENCOUNTER — Encounter: Payer: Self-pay | Admitting: Family

## 2016-11-16 VITALS — BP 98/78 | HR 74 | Temp 98.4°F | Resp 18 | Wt 136.4 lb

## 2016-11-16 DIAGNOSIS — F419 Anxiety disorder, unspecified: Secondary | ICD-10-CM

## 2016-11-16 MED ORDER — ESCITALOPRAM OXALATE 10 MG PO TABS: 10.0000 mg | ORAL_TABLET | Freq: Every day | ORAL | 3 refills | Status: DC

## 2016-11-16 NOTE — Patient Instructions (Signed)
Please continue lexapro.  

## 2016-11-16 NOTE — Progress Notes (Signed)
Subjective:    Patient ID: Julie Blackwell, female    DOB: December 11, 1990, 26 y.o.   MRN: [id number]  HPI  Pt presents today for follow up of her anxiety.  Last visit She described feeling anxious at recurrent panic attacks. She felt like her self-esteem is very low. She was having trouble focusing and finishing tasks. We gave her a trial of Lexapro. She reports that she initially had some nausea with the Lexapro. Nausea has now resolved. Otherwise she denies current side effects from medication. She reports that her mood is "so much better." Anxiety is much better as well. She does report that she continues to feel distracted at times but this is "just me."   Review of Systems See HPI  Past Medical History:  Diagnosis Date  . Anxiety   . Depression   . Endometriosis   . Fainting spell   . Frequent headaches   . Hormone imbalance   . Migraines      Social History   Social History  . Marital status: Single    Spouse name: N/A  . Number of children: N/A  . Years of education: N/A   Occupational History  . Not on file.   Social History Main Topics  . Smoking status: Never Smoker  . Smokeless tobacco: Never Used  . Alcohol use 0.0 oz/week     Comment: rare  . Drug use: Yes     Comment: rare Marijuana  . Sexual activity: Not Currently    Partners: Male    Birth control/ protection: Pill   Other Topics Concern  . Not on file   Social History Narrative  . No narrative on file    Past Surgical History:  Procedure Laterality Date  . APPENDECTOMY  2014  . LAPAROSCOPY     x2    Family History  Problem Relation Age of Onset  . Alcoholism Father        Deceased  . Liver cancer Father   . Cirrhosis Father   . Lung cancer Father   . Thyroid disease Mother        Living  . Anxiety disorder Mother   . Breast cancer Paternal Grandmother   . Heart disease Maternal Grandfather   . Thyroid disease Maternal Grandmother   . Aneurysm Maternal Aunt        Brain  . Colon  cancer Paternal Uncle   . Hyperlipidemia Brother   . ADD / ADHD Brother     Allergies  Allergen Reactions  . Codeine Nausea Only  . Mirena [Levonorgestrel]     Damage to Uterus    Current Outpatient Prescriptions on File Prior to Visit  Medication Sig Dispense Refill  . b complex vitamins tablet Take 1 tablet by mouth daily.    . Multiple Vitamins-Calcium (ONE-A-DAY WOMENS PO) Take 1 tablet by mouth daily.    Marland Kitchen OVER THE COUNTER MEDICATION Take 1 capsule by mouth every morning.    . Probiotic Product (PROBIOTIC DAILY PO) Take 1 capsule by mouth daily.     No current facility-administered medications on file prior to visit.     BP 98/78 (BP Location: Left Arm, Patient Position: Sitting, Cuff Size: Normal)   Pulse 74   Temp 98.4 F (36.9 C) (Oral)   Resp 18   Wt 136 lb 6.4 oz (61.9 kg)   SpO2 98%   BMI 24.16 kg/m       Objective:   Physical Exam  Constitutional: She is oriented  to person, place, and time. She appears well-developed and well-nourished. No distress.  Musculoskeletal: She exhibits no edema.  Neurological: She is alert and oriented to person, place, and time.  Skin: Skin is warm and dry.  Psychiatric: She has a normal mood and affect. Her behavior is normal. Judgment and thought content normal.          Assessment & Plan:  Anxiety-much improved on Lexapro. Continue same. We'll plan to bring her back in 3 months review of evaluation. She continues to have issues with distractibility, would consider evaluation for ADHD.

## 2017-01-25 LAB — HM PAP SMEAR: HM Pap smear: NORMAL

## 2017-02-15 ENCOUNTER — Ambulatory Visit (INDEPENDENT_AMBULATORY_CARE_PROVIDER_SITE_OTHER): Payer: 59 | Admitting: Family

## 2017-02-15 ENCOUNTER — Encounter: Payer: Self-pay | Admitting: Family

## 2017-02-15 VITALS — BP 115/70 | HR 81 | Temp 98.8°F | Resp 18 | Ht 63.0 in | Wt 141.0 lb

## 2017-02-15 DIAGNOSIS — L659 Nonscarring hair loss, unspecified: Secondary | ICD-10-CM | POA: Diagnosis not present

## 2017-02-15 DIAGNOSIS — F418 Other specified anxiety disorders: Secondary | ICD-10-CM | POA: Diagnosis not present

## 2017-02-15 DIAGNOSIS — K5900 Constipation, unspecified: Secondary | ICD-10-CM | POA: Diagnosis not present

## 2017-02-15 LAB — CBC WITH DIFFERENTIAL/PLATELET
BASOS PCT: 0.3 % (ref 0.0–3.0)
Basophils Absolute: 0 10*3/uL (ref 0.0–0.1)
EOS PCT: 1.4 % (ref 0.0–5.0)
Eosinophils Absolute: 0.1 10*3/uL (ref 0.0–0.7)
HCT: 40.2 % (ref 36.0–46.0)
Hemoglobin: 13.2 g/dL (ref 12.0–15.0)
LYMPHS PCT: 27.3 % (ref 12.0–46.0)
Lymphs Abs: 2.2 10*3/uL (ref 0.7–4.0)
MCHC: 32.9 g/dL (ref 30.0–36.0)
MCV: 97.6 fl (ref 78.0–100.0)
MONO ABS: 0.5 10*3/uL (ref 0.1–1.0)
MONOS PCT: 6.1 % (ref 3.0–12.0)
Neutro Abs: 5.1 10*3/uL (ref 1.4–7.7)
Neutrophils Relative %: 64.9 % (ref 43.0–77.0)
Platelets: 306 10*3/uL (ref 150.0–400.0)
RBC: 4.12 Mil/uL (ref 3.87–5.11)
RDW: 12.5 % (ref 11.5–15.5)
WBC: 7.9 10*3/uL (ref 4.0–10.5)

## 2017-02-15 LAB — TSH: TSH: 1.97 u[IU]/mL (ref 0.35–4.50)

## 2017-02-15 MED ORDER — ESCITALOPRAM OXALATE 10 MG PO TABS: 10.0000 mg | ORAL_TABLET | Freq: Every day | ORAL | 3 refills | Status: DC

## 2017-02-15 NOTE — Progress Notes (Addendum)
Subjective:    Patient ID: Julie Blackwell, female    DOB: 10/31/1990, 26 y.o.   MRN: [id number]  HPI Julie Blackwell is a 26 yr old female who presents today for follow up of her anxiety. She reports that she was doing well on lexapro, but then just stopped.  Reports that her anxiety and depression symptoms have worsened.  She is trying to get EAP counseling.  She recently had a tubal pregnancy and had her tube an ovary removed. She is sad about losing the pregnancy.  Since her miscarriage she reports that she is having constipation. Taking fiber supplement.  Had a BM 3 days ago. Feels uncomfortable from consipation.   Anxiety-   Review of Systems    see HPI  Past Medical History:  Diagnosis Date  . Anxiety   . Depression   . Endometriosis   . Fainting spell   . Frequent headaches   . Hormone imbalance   . Migraines      Social History   Social History  . Marital status: Single    Spouse name: N/A  . Number of children: N/A  . Years of education: N/A   Occupational History  . Not on file.   Social History Main Topics  . Smoking status: Never Smoker  . Smokeless tobacco: Never Used  . Alcohol use 0.0 oz/week     Comment: rare  . Drug use: Yes     Comment: rare Marijuana  . Sexual activity: Not Currently    Partners: Male    Birth control/ protection: Pill   Other Topics Concern  . Not on file   Social History Narrative  . No narrative on file    Past Surgical History:  Procedure Laterality Date  . APPENDECTOMY  2014  . LAPAROSCOPY     x2    Family History  Problem Relation Age of Onset  . Alcoholism Father        Deceased  . Liver cancer Father   . Cirrhosis Father   . Lung cancer Father   . Thyroid disease Mother        Living  . Anxiety disorder Mother   . Breast cancer Paternal Grandmother   . Heart disease Maternal Grandfather   . Thyroid disease Maternal Grandmother   . Aneurysm Maternal Aunt        Brain  . Colon cancer Paternal Uncle   .  Hyperlipidemia Brother   . ADD / ADHD Brother     Allergies  Allergen Reactions  . Codeine Nausea Only  . Mirena [Levonorgestrel]     Damage to Uterus    Current Outpatient Prescriptions on File Prior to Visit  Medication Sig Dispense Refill  . b complex vitamins tablet Take 1 tablet by mouth daily.    . Multiple Vitamins-Calcium (ONE-A-DAY WOMENS PO) Take 1 tablet by mouth daily.    Marland Kitchen. OVER THE COUNTER MEDICATION Take 1 capsule by mouth every morning.    . Probiotic Product (PROBIOTIC DAILY PO) Take 1 capsule by mouth daily.     No current facility-administered medications on file prior to visit.     BP 115/70 (BP Location: Left Arm, Cuff Size: Normal)   Pulse 81   Temp 98.8 F (37.1 C) (Oral)   Resp 18   Ht 5\' 3"  (1.6 m)   Wt 141 lb (64 kg)   SpO2 100%   BMI 24.98 kg/m    Objective:   Physical Exam  Constitutional: She is  oriented to person, place, and time. She appears well-developed and well-nourished.  Cardiovascular: Normal rate, regular rhythm and normal heart sounds.   No murmur heard. Pulmonary/Chest: Effort normal and breath sounds normal. No respiratory distress. She has no wheezes.  Musculoskeletal: She exhibits no edema.  Neurological: She is alert and oriented to person, place, and time.  Psychiatric: She has a normal mood and affect. Her behavior is normal. Judgment and thought content normal.          Assessment & Plan:  Hair loss- will check tsh, cbc. Advised trial of Biotin. Likely related to stress and hormonal changes.   Constipation- advised trial of prn miralax in addition to high fiber diet.

## 2017-02-15 NOTE — Assessment & Plan Note (Signed)
Uncontrolled. She is willing to restart lexapro and I have encouraged her to follow through with counseling. She is not planning another pregnancy at this time and will be following up with her GYN for nexplanon placement.

## 2017-02-15 NOTE — Patient Instructions (Addendum)
Restart lexapro. Please continue to work on getting established with a therapist. Complete lab work prior to leaving.

## 2017-02-16 ENCOUNTER — Encounter: Payer: Self-pay | Admitting: Family

## 2017-03-31 ENCOUNTER — Encounter: Payer: Self-pay | Admitting: Family

## 2017-03-31 ENCOUNTER — Ambulatory Visit (INDEPENDENT_AMBULATORY_CARE_PROVIDER_SITE_OTHER): Payer: 59 | Admitting: Family

## 2017-03-31 VITALS — BP 112/67 | HR 82 | Temp 98.9°F | Resp 16 | Ht 63.0 in | Wt 144.0 lb

## 2017-03-31 DIAGNOSIS — F418 Other specified anxiety disorders: Secondary | ICD-10-CM | POA: Diagnosis not present

## 2017-03-31 DIAGNOSIS — Z23 Encounter for immunization: Secondary | ICD-10-CM | POA: Diagnosis not present

## 2017-03-31 DIAGNOSIS — N926 Irregular menstruation, unspecified: Secondary | ICD-10-CM

## 2017-03-31 MED ORDER — ESCITALOPRAM OXALATE 20 MG PO TABS: 20.0000 mg | ORAL_TABLET | Freq: Every day | ORAL | 1 refills | Status: DC

## 2017-03-31 NOTE — Progress Notes (Signed)
Subjective:    Patient ID: Julie Blackwell, female    DOB: 06/30/1990, 26 y.o.   MRN: [id number]  HPI  Julie Blackwell is a 26 yr old female who presents today for follow up.  Last visit she described worsening anxiety/depression symptoms.  Last visit we restarted her lexapro and encouraged her to establish with a therapist.  Denies SI/HI.  Getting by at work.  Reports mood is still not great.  Still feeling sad.  Had appointment with a therapist but boss would not let her off work to go. Appointment has been rescheduled for January.   Anxiety is OK.  Reports symptoms "are mostly depression."   Has had bleeding x 18 days.  Started after she had nexplanon placed. Has follow up next week with her GYN.    Review of Systems  See HPI  Past Medical History:  Diagnosis Date  . Anxiety   . Depression   . Endometriosis   . Fainting spell   . Frequent headaches   . Hormone imbalance   . Migraines      Social History   Socioeconomic History  . Marital status: Single    Spouse name: Not on file  . Number of children: Not on file  . Years of education: Not on file  . Highest education level: Not on file  Social Needs  . Financial resource strain: Not on file  . Food insecurity - worry: Not on file  . Food insecurity - inability: Not on file  . Transportation needs - medical: Not on file  . Transportation needs - non-medical: Not on file  Occupational History  . Not on file  Tobacco Use  . Smoking status: Never Smoker  . Smokeless tobacco: Never Used  Substance and Sexual Activity  . Alcohol use: Yes    Alcohol/week: 0.0 oz    Comment: rare  . Drug use: Yes    Comment: rare Marijuana  . Sexual activity: Not Currently    Partners: Male    Birth control/protection: Pill  Other Topics Concern  . Not on file  Social History Narrative  . Not on file    Past Surgical History:  Procedure Laterality Date  . APPENDECTOMY  2014  . LAPAROSCOPY     x2    Family History  Problem  Relation Age of Onset  . Alcoholism Father        Deceased  . Liver cancer Father   . Cirrhosis Father   . Lung cancer Father   . Thyroid disease Mother        Living  . Anxiety disorder Mother   . Breast cancer Paternal Grandmother   . Heart disease Maternal Grandfather   . Thyroid disease Maternal Grandmother   . Aneurysm Maternal Aunt        Brain  . Colon cancer Paternal Uncle   . Hyperlipidemia Brother   . ADD / ADHD Brother     Allergies  Allergen Reactions  . Codeine Nausea Only  . Mirena [Levonorgestrel]     Damage to Uterus    Current Outpatient Medications on File Prior to Visit  Medication Sig Dispense Refill  . b complex vitamins tablet Take 1 tablet by mouth daily.    Marland Kitchen. escitalopram (LEXAPRO) 10 MG tablet Take 1 tablet (10 mg total) by mouth daily. 30 tablet 3  . Multiple Vitamins-Calcium (ONE-A-DAY WOMENS PO) Take 1 tablet by mouth daily.    Marland Kitchen. OVER THE COUNTER MEDICATION Take 1 capsule by  mouth every morning.    . Probiotic Product (PROBIOTIC DAILY PO) Take 1 capsule by mouth daily.     No current facility-administered medications on file prior to visit.     BP 112/67 (BP Location: Right Arm, Patient Position: Sitting, Cuff Size: Small)   Pulse 82   Temp 98.9 F (37.2 C) (Oral)   Resp 16   Ht 5\' 3"  (1.6 m)   Wt 144 lb (65.3 kg)   LMP 03/14/2017 Comment: on new birth control Implanon, bleeding since 03/14/17  SpO2 100%   BMI 25.51 kg/m    Objective:   Physical Exam  Constitutional: She is oriented to person, place, and time. She appears well-developed and well-nourished. No distress.  Musculoskeletal: She exhibits no edema.  Neurological: She is alert and oriented to person, place, and time.  Psychiatric: She has a normal mood and affect. Her behavior is normal. Judgment and thought content normal.          Assessment & Plan:  Anxiety/depression- depression remains suboptimally controlled. Will increase lexapro to 20mg  once daily.     Irregular menstrual bleeding- advised pt to keep upcoming apt with GYN.

## 2017-03-31 NOTE — Patient Instructions (Signed)
Please increase lexapro to 20mg.  

## 2017-05-12 ENCOUNTER — Ambulatory Visit: Payer: Self-pay | Admitting: Family

## 2017-05-14 ENCOUNTER — Ambulatory Visit (INDEPENDENT_AMBULATORY_CARE_PROVIDER_SITE_OTHER): Payer: 59 | Admitting: Family

## 2017-05-14 ENCOUNTER — Encounter: Payer: Self-pay | Admitting: Family

## 2017-05-14 VITALS — BP 110/82 | HR 98 | Temp 98.5°F | Resp 16 | Ht 63.0 in | Wt 146.0 lb

## 2017-05-14 DIAGNOSIS — F418 Other specified anxiety disorders: Secondary | ICD-10-CM

## 2017-05-14 MED ORDER — VENLAFAXINE HCL ER 37.5 MG PO CP24: 37.5000 mg | ORAL_CAPSULE | Freq: Every day | ORAL | 1 refills | Status: DC

## 2017-05-14 NOTE — Progress Notes (Signed)
Subjective:    Patient ID: Julie Blackwell, female    DOB: Apr 21, 1991, 27 y.o.   MRN: [id number]  HPI  Pt is a 27 yr old female who presents today for follow up of her anxiety/depression.  Last visit she noted minimal improvement in her symptoms on lexapro 18m.  We increased her dose to 252mon 12/5.  Reports that she feels "about the same." reports that she is easily distracted, having trouble finishing tasks. This is causing her issues at work. Had an ectopic pregnancy in October. Was having some motivational issues back in October, but feels more "like what's the point." denies SI.  She has nexplanon. Reports recently she has had "a lot more anxiety." trouble sleeping and trouble "shutting my brain off."  She has met twice with a counselor.   Wt Readings from Last 3 Encounters:  05/14/17 146 lb (66.2 kg)  03/31/17 144 lb (65.3 kg)  02/15/17 141 lb (64 kg)     Review of Systems See HPI  Past Medical History:  Diagnosis Date  . Anxiety   . Depression   . Endometriosis   . Fainting spell   . Frequent headaches   . Hormone imbalance   . Migraines      Social History   Socioeconomic History  . Marital status: Single    Spouse name: Not on file  . Number of children: Not on file  . Years of education: Not on file  . Highest education level: Not on file  Social Needs  . Financial resource strain: Not on file  . Food insecurity - worry: Not on file  . Food insecurity - inability: Not on file  . Transportation needs - medical: Not on file  . Transportation needs - non-medical: Not on file  Occupational History  . Not on file  Tobacco Use  . Smoking status: Never Smoker  . Smokeless tobacco: Never Used  Substance and Sexual Activity  . Alcohol use: Yes    Alcohol/week: 0.0 oz    Comment: rare  . Drug use: Yes    Comment: rare Marijuana  . Sexual activity: Not Currently    Partners: Male    Birth control/protection: Pill  Other Topics Concern  . Not on file  Social  History Narrative  . Not on file    Past Surgical History:  Procedure Laterality Date  . APPENDECTOMY  2014  . LAPAROSCOPY     x2    Family History  Problem Relation Age of Onset  . Alcoholism Father        Deceased  . Liver cancer Father   . Cirrhosis Father   . Lung cancer Father   . Thyroid disease Mother        Living  . Anxiety disorder Mother   . Breast cancer Paternal Grandmother   . Heart disease Maternal Grandfather   . Thyroid disease Maternal Grandmother   . Aneurysm Maternal Aunt        Brain  . Colon cancer Paternal Uncle   . Hyperlipidemia Brother   . ADD / ADHD Brother     Allergies  Allergen Reactions  . Codeine Nausea Only  . Mirena [Levonorgestrel]     Damage to Uterus    Current Outpatient Medications on File Prior to Visit  Medication Sig Dispense Refill  . b complex vitamins tablet Take 1 tablet by mouth daily.    . Marland Kitchenscitalopram (LEXAPRO) 20 MG tablet Take 1 tablet (20 mg total) by mouth  daily. 30 tablet 1  . Multiple Vitamins-Calcium (ONE-A-DAY WOMENS PO) Take 1 tablet by mouth daily.    Marland Kitchen OVER THE COUNTER MEDICATION Take 1 capsule by mouth every morning.    . Probiotic Product (PROBIOTIC DAILY PO) Take 1 capsule by mouth daily.     No current facility-administered medications on file prior to visit.     BP 110/82 (BP Location: Left Arm, Patient Position: Sitting, Cuff Size: Small)   Pulse 98   Temp 98.5 F (36.9 C) (Oral)   Resp 16   Ht 5' 3"  (1.6 m)   Wt 146 lb (66.2 kg)   LMP 04/15/2017   SpO2 100%   BMI 25.86 kg/m       Objective:   Physical Exam  Constitutional: She is oriented to person, place, and time. She appears well-developed and well-nourished. No distress.  Neurological: She is alert and oriented to person, place, and time.  Psychiatric: Her behavior is normal. Judgment and thought content normal.  Slightly flat affect          Assessment & Plan:  Depression/anxiety- advised pt as follows:   Work on  increasing exercise.  Continue your work with the counselor. Add effexor once daily and continue current dose of lexapro.  Follow up in 1 month- sooner if symptoms worsen.

## 2017-05-14 NOTE — Patient Instructions (Signed)
Work on increasing exercise.  Continue your work with the counselor. Add effexor once daily and continue current dose of lexapro.

## 2017-06-11 ENCOUNTER — Ambulatory Visit: Payer: Self-pay | Admitting: Family

## 2017-06-12 ENCOUNTER — Encounter: Payer: Self-pay | Admitting: Family

## 2017-06-14 NOTE — Telephone Encounter (Signed)
Notified pt and scheduled OV for Wednesday, 06/16/17 at 10:20am as pt states she is off work on Wednesday.

## 2017-06-16 ENCOUNTER — Telehealth: Payer: Self-pay | Admitting: Family

## 2017-06-16 ENCOUNTER — Ambulatory Visit (INDEPENDENT_AMBULATORY_CARE_PROVIDER_SITE_OTHER): Payer: 59 | Admitting: Family

## 2017-06-16 ENCOUNTER — Encounter: Payer: Self-pay | Admitting: Family

## 2017-06-16 VITALS — BP 105/72 | HR 74 | Temp 98.7°F | Resp 16 | Ht 63.0 in | Wt 141.0 lb

## 2017-06-16 DIAGNOSIS — F419 Anxiety disorder, unspecified: Secondary | ICD-10-CM

## 2017-06-16 MED ORDER — BUSPIRONE HCL 7.5 MG PO TABS
7.5000 mg | ORAL_TABLET | Freq: Two times a day (BID) | ORAL | 2 refills | Status: AC
Start: 2017-06-16 — End: ?

## 2017-06-16 MED ORDER — VENLAFAXINE HCL ER 75 MG PO CP24: 75.0000 mg | ORAL_CAPSULE | Freq: Every day | ORAL | 2 refills | Status: DC

## 2017-06-16 NOTE — Telephone Encounter (Signed)
Attempted to notify pt and left message to check mychart acct. Mychart message sent.

## 2017-06-16 NOTE — Telephone Encounter (Signed)
Please contact pt and let her know that after additional thought, I think it would be best to stop her effexor and place her on buspar instead.  I have cancelled effexor and sent buspar instead.  She should still plan to follow up in 1 month. Continue current dose of lexapro.

## 2017-06-16 NOTE — Progress Notes (Signed)
Subjective:    Patient ID: Julie Blackwell, female    DOB: 05/28/1990, 27 y.o.   MRN: [id number]  HPI  Patient is a 27 yr old female who presents today to discuss anxiety.  Last visit we added low effexor to her lexapro.  Reports more calm with day to day.  Reports that she had panic attack.  Reports that she is having trouble focusing, "more than ever." trouble paying attention.  She recently lost her grandfather unexpectedly.  She lived with her grandfather and she and her grandfather were primary caregivers for her grandmother.  Now that her grandfather has passed she is the primary caregiver for her grandmother.  She is having feelings of being overwhelmed with this responsibility and the grieving of her grandfather.  She does report that prior to her grandfather's passing she did feel some improvement in her anxiety symptoms with the addition of low-dose Effexor to her regimen.  She continues Lexapro.   Review of Systems    see HPI  Past Medical History:  Diagnosis Date  . Anxiety   . Depression   . Endometriosis   . Fainting spell   . Frequent headaches   . Hormone imbalance   . Migraines      Social History   Socioeconomic History  . Marital status: Single    Spouse name: Not on file  . Number of children: Not on file  . Years of education: Not on file  . Highest education level: Not on file  Social Needs  . Financial resource strain: Not on file  . Food insecurity - worry: Not on file  . Food insecurity - inability: Not on file  . Transportation needs - medical: Not on file  . Transportation needs - non-medical: Not on file  Occupational History  . Not on file  Tobacco Use  . Smoking status: Never Smoker  . Smokeless tobacco: Never Used  Substance and Sexual Activity  . Alcohol use: Yes    Alcohol/week: 0.0 oz    Comment: rare  . Drug use: Yes    Comment: rare Marijuana  . Sexual activity: Not Currently    Partners: Male    Birth control/protection: Pill    Other Topics Concern  . Not on file  Social History Narrative  . Not on file    Past Surgical History:  Procedure Laterality Date  . APPENDECTOMY  2014  . LAPAROSCOPY     x2    Family History  Problem Relation Age of Onset  . Alcoholism Father        Deceased  . Liver cancer Father   . Cirrhosis Father   . Lung cancer Father   . Thyroid disease Mother        Living  . Anxiety disorder Mother   . Breast cancer Paternal Grandmother   . Heart disease Maternal Grandfather   . Thyroid disease Maternal Grandmother   . Aneurysm Maternal Aunt        Brain  . Colon cancer Paternal Uncle   . Hyperlipidemia Brother   . ADD / ADHD Brother     Allergies  Allergen Reactions  . Codeine Nausea Only  . Mirena [Levonorgestrel]     Damage to Uterus    Current Outpatient Medications on File Prior to Visit  Medication Sig Dispense Refill  . b complex vitamins tablet Take 1 tablet by mouth daily.    Marland Kitchen. escitalopram (LEXAPRO) 20 MG tablet Take 1 tablet (20 mg total) by  mouth daily. 30 tablet 1  . Multiple Vitamins-Calcium (ONE-A-DAY WOMENS PO) Take 1 tablet by mouth daily.    Marland Kitchen OVER THE COUNTER MEDICATION Take 1 capsule by mouth every morning.    . Probiotic Product (PROBIOTIC DAILY PO) Take 1 capsule by mouth daily.    Marland Kitchen venlafaxine XR (EFFEXOR XR) 37.5 MG 24 hr capsule Take 1 capsule (37.5 mg total) by mouth daily with breakfast. 30 capsule 1   No current facility-administered medications on file prior to visit.     BP 105/72 (BP Location: Right Arm, Patient Position: Sitting, Cuff Size: Small)   Pulse 74   Temp 98.7 F (37.1 C) (Oral)   Resp 16   Ht 5\' 3"  (1.6 m)   Wt 141 lb (64 kg)   SpO2 100%   BMI 24.98 kg/m    Objective:   Physical Exam  Constitutional: She is oriented to person, place, and time. She appears well-developed and well-nourished. No distress.  Neurological: She is alert and oriented to person, place, and time.  Psychiatric: Her behavior is normal.  Judgment and thought content normal.  Somewhat anxious appearing female          Assessment & Plan:  Anxiety-deteriorated.  Initially I had thought about increasing her Effexor, but after further thought will DC Effexor and began BuSpar instead.  She will continue Lexapro.  See phone note.

## 2017-06-16 NOTE — Patient Instructions (Signed)
Please increase effexor to 75mg  once daily.

## 2017-06-17 MED ORDER — ESCITALOPRAM OXALATE 20 MG PO TABS: 20.0000 mg | ORAL_TABLET | Freq: Every day | ORAL | 2 refills | Status: AC

## 2017-06-18 ENCOUNTER — Ambulatory Visit: Payer: Self-pay | Admitting: Family

## 2017-07-21 ENCOUNTER — Ambulatory Visit: Payer: Self-pay | Admitting: Family

## 2017-07-21 DIAGNOSIS — Z0289 Encounter for other administrative examinations: Secondary | ICD-10-CM

## 2017-07-27 ENCOUNTER — Encounter: Payer: Self-pay | Admitting: Family

## 2018-01-24 ENCOUNTER — Encounter: Payer: Self-pay | Admitting: Family

## 2018-01-24 ENCOUNTER — Ambulatory Visit: Payer: 59 | Admitting: Family

## 2018-01-24 VITALS — BP 110/74 | HR 94 | Temp 98.6°F | Resp 16 | Ht 63.0 in | Wt 141.0 lb

## 2018-01-24 DIAGNOSIS — R350 Frequency of micturition: Secondary | ICD-10-CM | POA: Diagnosis not present

## 2018-01-24 DIAGNOSIS — Z23 Encounter for immunization: Secondary | ICD-10-CM | POA: Diagnosis not present

## 2018-01-24 LAB — POC URINALSYSI DIPSTICK (AUTOMATED)
Bilirubin, UA: NEGATIVE
GLUCOSE UA: NEGATIVE
Ketones, UA: NEGATIVE
NITRITE UA: NEGATIVE
Protein, UA: NEGATIVE
Spec Grav, UA: 1.015 (ref 1.010–1.025)
UROBILINOGEN UA: 0.2 U/dL
pH, UA: 6 (ref 5.0–8.0)

## 2018-01-24 MED ORDER — CIPROFLOXACIN HCL 500 MG PO TABS: 500.0000 mg | ORAL_TABLET | Freq: Two times a day (BID) | ORAL | 0 refills | Status: AC

## 2018-01-24 NOTE — Progress Notes (Signed)
Subjective:    Patient ID: Julie Blackwell, female    DOB: 1991/02/09, 27 y.o.   MRN: [id number]  HPI  Patient is a 27 yr old female who presents today with chief complaint of urinary frequency. Has associated abdominal pian/back pain.  Reports that symptoms started last Wednesday. Tried azo which helped. Reports fever on Friday 99.  + menses today.    Review of Systems See HPI  Past Medical History:  Diagnosis Date  . Anxiety   . Depression   . Endometriosis   . Fainting spell   . Frequent headaches   . Hormone imbalance   . Migraines      Social History   Socioeconomic History  . Marital status: Single    Spouse name: Not on file  . Number of children: Not on file  . Years of education: Not on file  . Highest education level: Not on file  Occupational History  . Not on file  Social Needs  . Financial resource strain: Not on file  . Food insecurity:    Worry: Not on file    Inability: Not on file  . Transportation needs:    Medical: Not on file    Non-medical: Not on file  Tobacco Use  . Smoking status: Never Smoker  . Smokeless tobacco: Never Used  Substance and Sexual Activity  . Alcohol use: Yes    Alcohol/week: 0.0 standard drinks    Comment: rare  . Drug use: Yes    Comment: rare Marijuana  . Sexual activity: Not Currently    Partners: Male    Birth control/protection: Pill  Lifestyle  . Physical activity:    Days per week: Not on file    Minutes per session: Not on file  . Stress: Not on file  Relationships  . Social connections:    Talks on phone: Not on file    Gets together: Not on file    Attends religious service: Not on file    Active member of club or organization: Not on file    Attends meetings of clubs or organizations: Not on file    Relationship status: Not on file  . Intimate partner violence:    Fear of current or ex partner: Not on file    Emotionally abused: Not on file    Physically abused: Not on file    Forced sexual  activity: Not on file  Other Topics Concern  . Not on file  Social History Narrative  . Not on file    Past Surgical History:  Procedure Laterality Date  . APPENDECTOMY  2014  . LAPAROSCOPY     x2    Family History  Problem Relation Age of Onset  . Alcoholism Father        Deceased  . Liver cancer Father   . Cirrhosis Father   . Lung cancer Father   . Thyroid disease Mother        Living  . Anxiety disorder Mother   . Breast cancer Paternal Grandmother   . Heart disease Maternal Grandfather   . Thyroid disease Maternal Grandmother   . Aneurysm Maternal Aunt        Brain  . Colon cancer Paternal Uncle   . Hyperlipidemia Brother   . ADD / ADHD Brother     Allergies  Allergen Reactions  . Codeine Nausea Only  . Mirena [Levonorgestrel]     Damage to Uterus    Current Outpatient Medications on File Prior  to Visit  Medication Sig Dispense Refill  . b complex vitamins tablet Take 1 tablet by mouth daily.    . busPIRone (BUSPAR) 7.5 MG tablet Take 1 tablet (7.5 mg total) by mouth 2 (two) times daily. 60 tablet 2  . escitalopram (LEXAPRO) 20 MG tablet Take 1 tablet (20 mg total) by mouth daily. 30 tablet 2  . Multiple Vitamins-Calcium (ONE-A-DAY WOMENS PO) Take 1 tablet by mouth daily.    Marland Kitchen OVER THE COUNTER MEDICATION Take 1 capsule by mouth every morning.    . Probiotic Product (PROBIOTIC DAILY PO) Take 1 capsule by mouth daily.     No current facility-administered medications on file prior to visit.     BP 110/74 (BP Location: Left Arm, Patient Position: Sitting, Cuff Size: Small)   Pulse 94   Temp 98.6 F (37 C) (Oral)   Resp 16   Ht 5\' 3"  (1.6 m)   Wt 141 lb (64 kg)   LMP 01/19/2018   SpO2 99%   BMI 24.98 kg/m       Objective:   Physical Exam  Constitutional: She is oriented to person, place, and time. She appears well-developed and well-nourished.  Cardiovascular: Normal rate, regular rhythm and normal heart sounds.  No murmur  heard. Pulmonary/Chest: Effort normal and breath sounds normal. No respiratory distress. She has no wheezes.  Abdominal: Soft. There is tenderness in the suprapubic area. There is CVA tenderness. There is no rigidity and no guarding.  Neurological: She is alert and oriented to person, place, and time. No cranial nerve deficit. Coordination normal.  Psychiatric: She has a normal mood and affect. Her behavior is normal. Judgment and thought content normal.          Assessment & Plan:  UTI- UA notes trace leuks and + blood (on menses). Will rx with cipro 500mg  bid x 7 to cover for pyelo due to CVAT. She is advised to call if new/worsening symptoms or if symptoms are not improved in 2-3 days. Pt verbalizes understanding.    Flu shot today.

## 2018-01-24 NOTE — Patient Instructions (Signed)
Please begin cipro (antibiotic). Call if new/worsening symptoms or if not improved in 2-3 days.

## 2018-01-26 LAB — URINE CULTURE
MICRO NUMBER: 91171302
SPECIMEN QUALITY:: ADEQUATE

## 2018-01-27 ENCOUNTER — Encounter: Payer: Self-pay | Admitting: Family
